# Patient Record
Sex: Female | Born: 1970 | Race: White | Hispanic: No | Marital: Single | Smoking: Current every day smoker
Health system: Southern US, Community
[De-identification: ages and names within clinical notes are randomized; demographics above are authoritative.]

## PROBLEM LIST (undated history)

## (undated) DIAGNOSIS — F419 Anxiety disorder, unspecified: Secondary | ICD-10-CM

## (undated) DIAGNOSIS — K219 Gastro-esophageal reflux disease without esophagitis: Secondary | ICD-10-CM

## (undated) DIAGNOSIS — C801 Malignant (primary) neoplasm, unspecified: Secondary | ICD-10-CM

## (undated) DIAGNOSIS — J45909 Unspecified asthma, uncomplicated: Secondary | ICD-10-CM

## (undated) DIAGNOSIS — E119 Type 2 diabetes mellitus without complications: Secondary | ICD-10-CM

## (undated) DIAGNOSIS — F32A Depression, unspecified: Secondary | ICD-10-CM

## (undated) DIAGNOSIS — F329 Major depressive disorder, single episode, unspecified: Secondary | ICD-10-CM

## (undated) DIAGNOSIS — Q359 Cleft palate, unspecified: Secondary | ICD-10-CM

## (undated) HISTORY — DX: Unspecified asthma, uncomplicated: J45.909

## (undated) HISTORY — PX: JOINT REPLACEMENT: SHX530

## (undated) HISTORY — PX: TOTAL HIP ARTHROPLASTY: SHX124

## (undated) HISTORY — DX: Depression, unspecified: F32.A

## (undated) HISTORY — DX: Major depressive disorder, single episode, unspecified: F32.9

## (undated) HISTORY — DX: Anxiety disorder, unspecified: F41.9

## (undated) HISTORY — PX: TUBAL LIGATION: SHX77

## (undated) HISTORY — PX: MOUTH SURGERY: SHX715

## (undated) HISTORY — DX: Gastro-esophageal reflux disease without esophagitis: K21.9

## (undated) HISTORY — DX: Malignant (primary) neoplasm, unspecified: C80.1

---

## 1998-08-09 ENCOUNTER — Emergency Department (HOSPITAL_COMMUNITY): Admission: EM | Admit: 1998-08-09 | Discharge: 1998-08-09 | Payer: Self-pay | Admitting: Emergency Medicine

## 1998-09-01 ENCOUNTER — Emergency Department (HOSPITAL_COMMUNITY): Admission: EM | Admit: 1998-09-01 | Discharge: 1998-09-01 | Payer: Self-pay | Admitting: Emergency Medicine

## 1998-10-15 ENCOUNTER — Emergency Department (HOSPITAL_COMMUNITY): Admission: EM | Admit: 1998-10-15 | Discharge: 1998-10-15 | Payer: Self-pay | Admitting: Emergency Medicine

## 1999-04-22 ENCOUNTER — Emergency Department (HOSPITAL_COMMUNITY): Admission: EM | Admit: 1999-04-22 | Discharge: 1999-04-22 | Payer: Self-pay | Admitting: Emergency Medicine

## 1999-05-13 ENCOUNTER — Inpatient Hospital Stay (HOSPITAL_COMMUNITY): Admission: AD | Admit: 1999-05-13 | Discharge: 1999-05-17 | Payer: Self-pay | Admitting: *Deleted

## 2001-02-02 ENCOUNTER — Emergency Department (HOSPITAL_COMMUNITY): Admission: EM | Admit: 2001-02-02 | Discharge: 2001-02-02 | Payer: Self-pay | Admitting: *Deleted

## 2001-02-17 ENCOUNTER — Emergency Department (HOSPITAL_COMMUNITY): Admission: EM | Admit: 2001-02-17 | Discharge: 2001-02-17 | Payer: Self-pay | Admitting: *Deleted

## 2001-04-19 ENCOUNTER — Ambulatory Visit (HOSPITAL_COMMUNITY): Admission: RE | Admit: 2001-04-19 | Discharge: 2001-04-19 | Payer: Self-pay | Admitting: Pulmonary Disease

## 2002-02-23 ENCOUNTER — Emergency Department (HOSPITAL_COMMUNITY): Admission: EM | Admit: 2002-02-23 | Discharge: 2002-02-23 | Payer: Self-pay | Admitting: Emergency Medicine

## 2002-04-20 ENCOUNTER — Emergency Department (HOSPITAL_COMMUNITY): Admission: EM | Admit: 2002-04-20 | Discharge: 2002-04-20 | Payer: Self-pay | Admitting: *Deleted

## 2002-05-18 ENCOUNTER — Encounter: Payer: Self-pay | Admitting: Internal Medicine

## 2002-05-18 ENCOUNTER — Ambulatory Visit: Admission: RE | Admit: 2002-05-18 | Discharge: 2002-05-18 | Payer: Self-pay | Admitting: Internal Medicine

## 2002-07-28 ENCOUNTER — Emergency Department (HOSPITAL_COMMUNITY): Admission: EM | Admit: 2002-07-28 | Discharge: 2002-07-28 | Payer: Self-pay | Admitting: *Deleted

## 2002-07-28 ENCOUNTER — Encounter: Payer: Self-pay | Admitting: *Deleted

## 2003-11-28 ENCOUNTER — Emergency Department (HOSPITAL_COMMUNITY): Admission: EM | Admit: 2003-11-28 | Discharge: 2003-11-28 | Payer: Self-pay | Admitting: Emergency Medicine

## 2003-12-15 ENCOUNTER — Emergency Department (HOSPITAL_COMMUNITY): Admission: EM | Admit: 2003-12-15 | Discharge: 2003-12-15 | Payer: Self-pay | Admitting: Emergency Medicine

## 2003-12-17 ENCOUNTER — Emergency Department (HOSPITAL_COMMUNITY): Admission: EM | Admit: 2003-12-17 | Discharge: 2003-12-17 | Payer: Self-pay | Admitting: Emergency Medicine

## 2003-12-19 ENCOUNTER — Emergency Department (HOSPITAL_COMMUNITY): Admission: EM | Admit: 2003-12-19 | Discharge: 2003-12-19 | Payer: Self-pay | Admitting: Emergency Medicine

## 2004-01-06 ENCOUNTER — Emergency Department (HOSPITAL_COMMUNITY): Admission: EM | Admit: 2004-01-06 | Discharge: 2004-01-06 | Payer: Self-pay | Admitting: *Deleted

## 2004-02-07 ENCOUNTER — Emergency Department (HOSPITAL_COMMUNITY): Admission: EM | Admit: 2004-02-07 | Discharge: 2004-02-07 | Payer: Self-pay | Admitting: Emergency Medicine

## 2004-04-03 ENCOUNTER — Ambulatory Visit (HOSPITAL_COMMUNITY): Admission: RE | Admit: 2004-04-03 | Discharge: 2004-04-03 | Payer: Self-pay | Admitting: Internal Medicine

## 2004-04-09 ENCOUNTER — Emergency Department (HOSPITAL_COMMUNITY): Admission: EM | Admit: 2004-04-09 | Discharge: 2004-04-09 | Payer: Self-pay | Admitting: Emergency Medicine

## 2004-04-11 ENCOUNTER — Emergency Department (HOSPITAL_COMMUNITY): Admission: EM | Admit: 2004-04-11 | Discharge: 2004-04-11 | Payer: Self-pay | Admitting: Emergency Medicine

## 2004-04-14 ENCOUNTER — Emergency Department (HOSPITAL_COMMUNITY): Admission: EM | Admit: 2004-04-14 | Discharge: 2004-04-14 | Payer: Self-pay | Admitting: Emergency Medicine

## 2004-05-04 ENCOUNTER — Emergency Department (HOSPITAL_COMMUNITY): Admission: EM | Admit: 2004-05-04 | Discharge: 2004-05-04 | Payer: Self-pay | Admitting: Emergency Medicine

## 2004-06-07 ENCOUNTER — Emergency Department (HOSPITAL_COMMUNITY): Admission: EM | Admit: 2004-06-07 | Discharge: 2004-06-07 | Payer: Self-pay | Admitting: Emergency Medicine

## 2004-07-10 ENCOUNTER — Emergency Department (HOSPITAL_COMMUNITY): Admission: EM | Admit: 2004-07-10 | Discharge: 2004-07-10 | Payer: Self-pay | Admitting: Emergency Medicine

## 2004-10-30 ENCOUNTER — Emergency Department (HOSPITAL_COMMUNITY): Admission: EM | Admit: 2004-10-30 | Discharge: 2004-10-30 | Payer: Self-pay | Admitting: Emergency Medicine

## 2004-11-02 ENCOUNTER — Emergency Department (HOSPITAL_COMMUNITY): Admission: EM | Admit: 2004-11-02 | Discharge: 2004-11-02 | Payer: Self-pay | Admitting: Emergency Medicine

## 2005-02-17 ENCOUNTER — Emergency Department (HOSPITAL_COMMUNITY): Admission: EM | Admit: 2005-02-17 | Discharge: 2005-02-17 | Payer: Self-pay | Admitting: *Deleted

## 2005-04-22 ENCOUNTER — Ambulatory Visit (HOSPITAL_COMMUNITY): Admission: AD | Admit: 2005-04-22 | Discharge: 2005-04-22 | Payer: Self-pay | Admitting: Obstetrics and Gynecology

## 2005-04-24 ENCOUNTER — Ambulatory Visit (HOSPITAL_COMMUNITY): Admission: AD | Admit: 2005-04-24 | Discharge: 2005-04-24 | Payer: Self-pay | Admitting: Obstetrics and Gynecology

## 2005-04-28 ENCOUNTER — Ambulatory Visit (HOSPITAL_COMMUNITY): Admission: AD | Admit: 2005-04-28 | Discharge: 2005-04-28 | Payer: Self-pay | Admitting: Obstetrics and Gynecology

## 2005-04-29 ENCOUNTER — Ambulatory Visit (HOSPITAL_COMMUNITY): Admission: AD | Admit: 2005-04-29 | Discharge: 2005-04-29 | Payer: Self-pay | Admitting: Obstetrics and Gynecology

## 2005-05-02 ENCOUNTER — Ambulatory Visit (HOSPITAL_COMMUNITY): Admission: AD | Admit: 2005-05-02 | Discharge: 2005-05-02 | Payer: Self-pay | Admitting: Obstetrics and Gynecology

## 2005-05-05 ENCOUNTER — Ambulatory Visit (HOSPITAL_COMMUNITY): Admission: AD | Admit: 2005-05-05 | Discharge: 2005-05-05 | Payer: Self-pay | Admitting: Obstetrics and Gynecology

## 2005-05-07 ENCOUNTER — Encounter: Payer: Self-pay | Admitting: Obstetrics and Gynecology

## 2005-05-07 ENCOUNTER — Inpatient Hospital Stay (HOSPITAL_COMMUNITY): Admission: AD | Admit: 2005-05-07 | Discharge: 2005-05-10 | Payer: Self-pay | Admitting: Obstetrics and Gynecology

## 2005-06-05 ENCOUNTER — Ambulatory Visit (HOSPITAL_COMMUNITY): Admission: RE | Admit: 2005-06-05 | Discharge: 2005-06-05 | Payer: Self-pay | Admitting: Family Medicine

## 2006-12-31 ENCOUNTER — Ambulatory Visit (HOSPITAL_COMMUNITY): Admission: RE | Admit: 2006-12-31 | Discharge: 2006-12-31 | Payer: Self-pay | Admitting: Family Medicine

## 2007-02-02 ENCOUNTER — Encounter (HOSPITAL_COMMUNITY): Admission: RE | Admit: 2007-02-02 | Discharge: 2007-03-04 | Payer: Self-pay | Admitting: Neurological Surgery

## 2007-02-05 ENCOUNTER — Encounter: Admission: RE | Admit: 2007-02-05 | Discharge: 2007-02-05 | Payer: Self-pay | Admitting: Neurological Surgery

## 2007-04-30 ENCOUNTER — Encounter: Admission: RE | Admit: 2007-04-30 | Discharge: 2007-04-30 | Payer: Self-pay | Admitting: Neurological Surgery

## 2007-05-13 ENCOUNTER — Emergency Department (HOSPITAL_COMMUNITY): Admission: EM | Admit: 2007-05-13 | Discharge: 2007-05-13 | Payer: Self-pay | Admitting: Emergency Medicine

## 2007-06-18 ENCOUNTER — Encounter: Admission: RE | Admit: 2007-06-18 | Discharge: 2007-06-18 | Payer: Self-pay | Admitting: Neurological Surgery

## 2007-06-30 ENCOUNTER — Ambulatory Visit (HOSPITAL_COMMUNITY): Admission: RE | Admit: 2007-06-30 | Discharge: 2007-06-30 | Payer: Self-pay | Admitting: Family Medicine

## 2007-06-30 ENCOUNTER — Emergency Department (HOSPITAL_COMMUNITY): Admission: EM | Admit: 2007-06-30 | Discharge: 2007-06-30 | Payer: Self-pay | Admitting: Emergency Medicine

## 2009-09-09 ENCOUNTER — Emergency Department (HOSPITAL_COMMUNITY): Admission: EM | Admit: 2009-09-09 | Discharge: 2009-09-09 | Payer: Self-pay | Admitting: Emergency Medicine

## 2010-07-13 ENCOUNTER — Encounter: Payer: Self-pay | Admitting: Family Medicine

## 2010-07-14 ENCOUNTER — Encounter: Payer: Self-pay | Admitting: Neurological Surgery

## 2010-07-15 ENCOUNTER — Encounter: Payer: Self-pay | Admitting: Neurological Surgery

## 2010-11-08 NOTE — Op Note (Signed)
Diane Buchanan, Diane Buchanan NO.:  [id number]   MEDICAL RECORD NO.:  [id number]          PATIENT TYPE:  INP   LOCATION:  A412                          FACILITY:  [facility]   PHYSICIAN:  Tilda Burrow, M.D. DATE OF BIRTH:  03-21-1971   DATE OF PROCEDURE:  DATE OF DISCHARGE:                                 OPERATIVE REPORT   PREOPERATIVE DIAGNOSIS:  Pregnancy of 39-1/2 weeks.  Scheduled induction.  Uncertain fetal status.  Desire for elective permanent sterilization.  Methadone use for chronic pain and narcotic addiction.   POSTOPERATIVE DIAGNOSIS:  Pregnancy of 39-1/2 weeks.  Scheduled induction.  Uncertain fetal status.  Elective permanent sterilization.  Methadone use  for chronic pain and narcotic addiction.  Oligohydramnios.   PROCEDURE:  Primary low transverse cesarean section, bilateral partial  salpingectomy.   SURGEON:  Tilda Burrow, M.D.   ASSISTANTHart Rochester, CMN.   ANESTHESIA:  Nelda Severe, CRNA.  Spinal, converted to general.   COMPLICATIONS:  None.   FINDINGS:  1.  Incomplete analgesic effect from symmetric spinal, necessitating      conversion to general anesthesia.  2.  Healthy female infant, Apgars 9 and 9 with oligohydramnios.   INDICATIONS:  A 40 year old scheduled for induction at 39 weeks with  reactive NST two days ago with spontaneous decelerations to the 60s as we  prepared her for induction.  The Foley bulb was removed, but she nonetheless  had prolonged decelerations persisting, so was taken to the OR for C-  section.  Permanent sterilization requested.   DETAILS OF PROCEDURE:  Patient was taken to the operating room, prepped and  draped.  Spinal was not adequately dense but very effective and very  symmetric in its distribution, so we converted to general.  A Pfannenstiel  incision performed without difficulty.  Bladder flap developed.  A  transverse uterine incision made on a very well-developed lower uterine  segment, being particularly  careful to avoid contact on the fetal scalp,  which was tightly adherent to the lower uterine segment.  We were able to  identify the minimal amniotic fluid between the head and lower uterine  segment.  We opened the lower uterine segment transversely using index  finger traction and delivered the vertex with fundal pressure and guidance.  The infant showed good tone and grimace.  Bulb suction was performed.  There  was minimal amniotic fluid around the baby.  We passed the baby to Dr. Milford Cage  for his care.  See his notes for further details.  Apgars 9 and 9.   Cord blood samples obtained to mixed arterial and venous pH sample and  venous sample for routine analysis.  Placenta was delivered intact but in  swabbing the uterine fundus, there was decidual tissue, raising the question  if there had been a subplacental bleed in the past.  This was sent as a  separate specimen.   We then irrigated the uterus, closed it with a single layer of running,  locking 0 chromic.  Reapproximated the bladder flap with 2-0 chromic.  Reapproximated the peritoneum.  Prior to peritoneal closure, we did  tubal  ligation by doubly ligating random and segment knuckle tube and excising the  incarcerated specimen.  We then proceeded to close the peritoneum and close  the fascia with 0 Vicryl.  Close the subcutaneous tissues with 2-0 plain  after irrigating the antibiotic solution, then close the skin with staples.  EBL 500 cc.   Condition to the recovery room excellent.  Patient will be maintained on her  methadone during the operating room and weaned gradually in the coming  months.      Tilda Burrow, M.D.  Electronically Signed     JVF/MEDQ  D:  05/07/2005  T:  05/07/2005  Job:  [id number]   cc:   Francoise Schaumann. Halford Chessman  Fax: [phone]

## 2010-11-08 NOTE — H&P (Signed)
Diane Buchanan, Diane Buchanan NO.:  [id number]   MEDICAL RECORD NO.:  [id number]          PATIENT TYPE:  INP   LOCATION:  LDR1                          FACILITY:  [facility]   PHYSICIAN:  Tilda Burrow, M.D. DATE OF BIRTH:  April 10, 1971   DATE OF ADMISSION:  05/07/2005  DATE OF DISCHARGE:  LH                                HISTORY & PHYSICAL   REASON FOR ADMISSION:  Pregnancy at 40 weeks for induction of labor.   HISTORY OF PRESENT ILLNESS:  Diane Buchanan is a 40 year old gravida 3, para 2 who  presents for induction of labor due to methadone addiction and heroin  addiction.   PAST MEDICAL HISTORY:  Past medical history is positive for cleft lip and  palate, spina bifida, heroin addiction, methadone addiction.   PAST SURGICAL HISTORY:  Past surgical history is positive for palate  surgery.   ALLERGIES:  No known drug allergies.   CURRENT MEDICATIONS:  She is taking hydrocodone, methadone.   PRENATAL COURSE:  Prenatal course has been complicated with significant  methadone use, significant nicotine addiction, and also p.o. pain medication  and nausea medicines.  Blood type is RH negative, O-negative.  Rubella is  immune.  Hepatitis C surface antigen is negative.  HIV is non-reactive.  Serology is non-reactive.  Pap is normal.  __________ is negative.  The 28-  week hemoglobin is 11.1, 28-week hematocrit is 36.8, one-hour glucose is  107.   PHYSICAL EXAMINATION:  VITAL SIGNS:  Vital signs are stable.  Fetal heart  rate pattern is stable, but there are variable prolonged decelerations at  intervals that happen spontaneously without uterine contractions.  Cervix is  2 cm, 80% effaced, and -1 station.   PLAN:  We are going to do primary Cesarean section with bilateral tubal  ligation.      Diane Buchanan, Diane Buchanan      Tilda Burrow, M.D.  Electronically Signed    DL/MEDQ  D:  04/54/0981  T:  05/07/2005  Job:  [id number]   cc:   Tilda Burrow, M.D.  Fax: [phone]

## 2010-11-08 NOTE — Discharge Summary (Signed)
Diane Buchanan, Diane Buchanan NO.:  [id number]   MEDICAL RECORD NO.:  [id number]          PATIENT TYPE:  INP   LOCATION:  A412                          FACILITY:  [facility]   PHYSICIAN:  Tilda Burrow, M.D. DATE OF BIRTH:  [birth year]   DATE OF ADMISSION:  05/07/2005  DATE OF DISCHARGE:  11/18/2006LH                                 DISCHARGE SUMMARY   ADMITTING DIAGNOSES:  1.  Pregnancy term.  2.  Active labor.  3.  Methadone addiction.  4.  History of oxycodone addiction.  5.  Elective sterilization.   DISCHARGE DIAGNOSES:  1.  Pregnancy at 40 weeks delivered uncertain fetal status.  2.  Methadone addiction (opiate addiction).  3.  Elective sterilization.   PROCEDURE:  Primary low transverse cervical cesarean section and bilateral  partial salpingectomy performed on May 07, 2005 by Jannifer Franklin.   HOSPITAL SUMMARY:  This 40 year old female gravida 3, para 2 was admitted  for induction of labor due to methadone addiction.  Medical history is  positive for cleft lip and cleft palate, history of spina bifida occulta,  for which patient blames her chronic back pain that is her alleged reason  for her addiction.  Surgical history is positive for palate surgery which  did not completely close over the palate.  No known drug allergies.  Medications hydrocodone and methadone.  Prenatal course was notable for  Vicodin and Tylox overuse until late in the pregnancy when we felt that the  methadone  was a superior option to her extensive oxycodone use.   Patient was admitted on November 15 and underwent insertion of Foley bulb  through the cervix with the patient having variable decelerations as a  result of it.  She then had IV stopped, O2 started and Foley bulb removed.  The patient had a spontaneous deceleration again 10 minutes later while on  no tocolytic agent.  Discussion was performed and decision made to proceed  with surgery.   Postoperative course was  notable for unusually high Dilaudid and Valium use  for the patient.  She maximized her Dilaudid PCA Pump use even when on 1.5  times the normal dose.  Postoperatively hemoglobin was 10, hematocrit 30.  Patient was considered stable for discharge and routine postsurgical  instructions when discharged May 10, 2005 for followup 1 week our  office.      Tilda Burrow, M.D.  Electronically Signed     JVF/MEDQ  D:  05/26/2005  T:  05/26/2005  Job:  [id number]

## 2010-11-19 ENCOUNTER — Ambulatory Visit (HOSPITAL_COMMUNITY)
Admission: RE | Admit: 2010-11-19 | Discharge: 2010-11-19 | Disposition: A | Discharge status: Home / Self Care | Payer: Medicaid Other | Source: Ambulatory Visit | Attending: Family Medicine | Admitting: Family Medicine

## 2010-11-19 ENCOUNTER — Other Ambulatory Visit (HOSPITAL_COMMUNITY): Payer: Self-pay | Admitting: Family Medicine

## 2010-11-19 DIAGNOSIS — T148XXA Other injury of unspecified body region, initial encounter: Secondary | ICD-10-CM

## 2010-11-19 DIAGNOSIS — M79609 Pain in unspecified limb: Secondary | ICD-10-CM | POA: Insufficient documentation

## 2010-11-19 DIAGNOSIS — M2010 Hallux valgus (acquired), unspecified foot: Secondary | ICD-10-CM | POA: Insufficient documentation

## 2010-12-26 ENCOUNTER — Ambulatory Visit (INDEPENDENT_AMBULATORY_CARE_PROVIDER_SITE_OTHER): Payer: Medicaid Other | Admitting: Otolaryngology

## 2011-01-02 ENCOUNTER — Ambulatory Visit (INDEPENDENT_AMBULATORY_CARE_PROVIDER_SITE_OTHER): Payer: Medicaid Other | Admitting: Otolaryngology

## 2011-01-16 ENCOUNTER — Ambulatory Visit (INDEPENDENT_AMBULATORY_CARE_PROVIDER_SITE_OTHER): Payer: Medicaid Other | Admitting: Otolaryngology

## 2011-01-23 ENCOUNTER — Ambulatory Visit (INDEPENDENT_AMBULATORY_CARE_PROVIDER_SITE_OTHER): Payer: Medicaid Other | Admitting: Otolaryngology

## 2011-01-23 DIAGNOSIS — K2100 Gastro-esophageal reflux disease with esophagitis, without bleeding: Secondary | ICD-10-CM

## 2011-01-23 DIAGNOSIS — R490 Dysphonia: Secondary | ICD-10-CM

## 2013-07-14 ENCOUNTER — Ambulatory Visit (INDEPENDENT_AMBULATORY_CARE_PROVIDER_SITE_OTHER): Payer: Medicaid Other | Admitting: Otolaryngology

## 2013-07-14 DIAGNOSIS — K219 Gastro-esophageal reflux disease without esophagitis: Secondary | ICD-10-CM

## 2013-08-25 ENCOUNTER — Ambulatory Visit (INDEPENDENT_AMBULATORY_CARE_PROVIDER_SITE_OTHER): Payer: Medicaid Other | Admitting: Otolaryngology

## 2013-12-02 ENCOUNTER — Emergency Department (HOSPITAL_COMMUNITY)
Admission: EM | Admit: 2013-12-02 | Discharge: 2013-12-02 | Disposition: A | Discharge status: Home / Self Care | Payer: Medicaid Other | Attending: Emergency Medicine | Admitting: Emergency Medicine

## 2013-12-02 ENCOUNTER — Encounter (HOSPITAL_COMMUNITY): Payer: Self-pay | Admitting: Emergency Medicine

## 2013-12-02 DIAGNOSIS — E119 Type 2 diabetes mellitus without complications: Secondary | ICD-10-CM | POA: Insufficient documentation

## 2013-12-02 DIAGNOSIS — Z8739 Personal history of other diseases of the musculoskeletal system and connective tissue: Secondary | ICD-10-CM | POA: Insufficient documentation

## 2013-12-02 DIAGNOSIS — Z79899 Other long term (current) drug therapy: Secondary | ICD-10-CM | POA: Insufficient documentation

## 2013-12-02 DIAGNOSIS — F17200 Nicotine dependence, unspecified, uncomplicated: Secondary | ICD-10-CM | POA: Insufficient documentation

## 2013-12-02 DIAGNOSIS — R739 Hyperglycemia, unspecified: Secondary | ICD-10-CM

## 2013-12-02 HISTORY — DX: Type 2 diabetes mellitus without complications: E11.9

## 2013-12-02 HISTORY — DX: Cleft palate, unspecified: Q35.9

## 2013-12-02 LAB — CBG MONITORING, ED: GLUCOSE-CAPILLARY: 310 mg/dL — AB (ref 70–99)

## 2013-12-02 NOTE — ED Provider Notes (Signed)
CSN: [id number]     Arrival date & time 12/02/13  1401 History  This chart was scribed for Nat Christen, MD by Roe Coombs, ED Scribe. The patient was seen in room APA04/APA04. Patient's care was started at 2:41 PM.   Chief Complaint  Patient presents with  . Hyperglycemia    The history is provided by the patient. No language interpreter was used.    HPI Comments: Diane Buchanan is a 43 y.o. female with history of DM who presents to the Emergency Department complaining of hyperglycemia. Patient says that she ate tacos and had ice cream last night for dinner. When she woke up this morning, her sugar was elevated to 500. When she arrived in the ED, her sugar was improved to 300. She takes two 500 mg of metformin twice daily. Patient admits that she understands why her sugar became elevated based on her meal last night. She denies any physical complaints at this time. There is no headache, shortness of breath, chest pain, dizziness or urinary symptoms. She states she has an 41 year old and gets a lot of exercise.   PCP - Maricela Curet, MD    Past Medical History  Diagnosis Date  . Diabetes mellitus without complication   . Cleft palate    Past Surgical History  Procedure Laterality Date  . Joint replacement    . Total hip arthroplasty    . Tubal ligation     History reviewed. No pertinent family history. History  Substance Use Topics  . Smoking status: Current Every Day Smoker  . Smokeless tobacco: Not on file  . Alcohol Use: No   OB History   Grav Para Term Preterm Abortions TAB SAB Ect Mult Living                 Review of Systems A complete 10 system review of systems was obtained and all systems are negative except as noted in the HPI and PMH.   Allergies  Review of patient's allergies indicates no known allergies.  Home Medications   Prior to Admission medications   Medication Sig Start Date End Date Taking? Authorizing Provider  ALPRAZolam Duanne Moron) 1 MG  tablet Take 1 mg by mouth 4 (four) times daily as needed for anxiety.   Yes Historical Provider, MD  amphetamine-dextroamphetamine (ADDERALL) 20 MG tablet Take 10 mg by mouth 2 (two) times daily.   Yes Historical Provider, MD  cyclobenzaprine (FLEXERIL) 10 MG tablet Take 10 mg by mouth 3 (three) times daily as needed for muscle spasms.   Yes Historical Provider, MD  desvenlafaxine (PRISTIQ) 50 MG 24 hr tablet Take 50 mg by mouth daily.   Yes Historical Provider, MD  metFORMIN (GLUCOPHAGE) 500 MG tablet Take 500 mg by mouth 2 (two) times daily with a meal.   Yes Historical Provider, MD  methadone (DOLOPHINE) 10 MG tablet Take 10 mg by mouth 3 (three) times daily.   Yes Historical Provider, MD  pregabalin (LYRICA) 150 MG capsule Take 150 mg by mouth 3 (three) times daily.   Yes Historical Provider, MD   Triage Vitals: BP 137/92  Pulse 77  Temp(Src) 97.9 F (36.6 C) (Oral)  Resp 18  Ht 5\' 6"  (1.676 m)  Wt 188 lb 3 oz (85.361 kg)  BMI 30.39 kg/m2  SpO2 98%  LMP 12/02/2013 Physical Exam  Nursing note and vitals reviewed. Constitutional: She is oriented to person, place, and time. She appears well-developed and well-nourished.  HENT:  Head: Normocephalic and  atraumatic.  Eyes: Conjunctivae and EOM are normal. Pupils are equal, round, and reactive to light.  Neck: Normal range of motion. Neck supple.  Cardiovascular: Normal rate, regular rhythm and normal heart sounds.   Pulmonary/Chest: Effort normal and breath sounds normal.  Abdominal: Soft. Bowel sounds are normal.  Musculoskeletal: Normal range of motion.  Neurological: She is alert and oriented to person, place, and time.  Skin: Skin is warm and dry.  Psychiatric: She has a normal mood and affect. Her behavior is normal.    ED Course  Procedures (including critical care time) DIAGNOSTIC STUDIES: Oxygen Saturation is 98% on room air, normal by my interpretation.    COORDINATION OF CARE: 2:46 PM- I will send her home. Patient  informed of current plan for treatment and evaluation and agrees with plan at this time.  Results for orders placed during the hospital encounter of 12/02/13  CBG MONITORING, ED      Result Value Ref Range   Glucose-Capillary 310 (*) 70 - 99 mg/dL   Comment 1 Notify RN      MDM   Final diagnoses:  Hyperglycemia    Glucose is now 310.    Patient has no somatic complaints.    Continue same protocol  I personally performed the services described in this documentation, which was scribed in my presence. The recorded information has been reviewed and is accurate.    Nat Christen, MD 12/02/13 [phone]

## 2013-12-02 NOTE — Discharge Instructions (Signed)
Follow your sugars carefully. Try to lose weight.

## 2013-12-02 NOTE — ED Notes (Addendum)
Pt says  Her cbg was over 500.   Had eaten ice cream app 5am.Took metformin  pta  CBG here was 310.  Pts speech is difficult to understand due to cleft palate

## 2013-12-02 NOTE — ED Notes (Signed)
Pt states had tacos last night and ice cream. STates when she woke up took her sugar and was 500. Pt states took her morning metformin and came this here. Pt states feels fine otherwise, no complaints. Pt denies pain. Pt states since sugar is already coming down feels like she doesn't need to be here but will do whatever we need her to do.

## 2014-12-04 ENCOUNTER — Emergency Department (HOSPITAL_COMMUNITY): Payer: Medicaid Other

## 2014-12-04 ENCOUNTER — Emergency Department (HOSPITAL_COMMUNITY)
Admission: EM | Admit: 2014-12-04 | Discharge: 2014-12-04 | Disposition: A | Discharge status: Home / Self Care | Payer: Medicaid Other | Attending: Emergency Medicine | Admitting: Emergency Medicine

## 2014-12-04 ENCOUNTER — Encounter (HOSPITAL_COMMUNITY): Payer: Self-pay | Admitting: Cardiology

## 2014-12-04 DIAGNOSIS — Y9389 Activity, other specified: Secondary | ICD-10-CM | POA: Diagnosis not present

## 2014-12-04 DIAGNOSIS — X58XXXA Exposure to other specified factors, initial encounter: Secondary | ICD-10-CM | POA: Insufficient documentation

## 2014-12-04 DIAGNOSIS — E119 Type 2 diabetes mellitus without complications: Secondary | ICD-10-CM | POA: Insufficient documentation

## 2014-12-04 DIAGNOSIS — Z79899 Other long term (current) drug therapy: Secondary | ICD-10-CM | POA: Insufficient documentation

## 2014-12-04 DIAGNOSIS — Y998 Other external cause status: Secondary | ICD-10-CM | POA: Insufficient documentation

## 2014-12-04 DIAGNOSIS — Y9289 Other specified places as the place of occurrence of the external cause: Secondary | ICD-10-CM | POA: Insufficient documentation

## 2014-12-04 DIAGNOSIS — Z720 Tobacco use: Secondary | ICD-10-CM | POA: Diagnosis not present

## 2014-12-04 DIAGNOSIS — R4182 Altered mental status, unspecified: Secondary | ICD-10-CM | POA: Diagnosis present

## 2014-12-04 DIAGNOSIS — Q359 Cleft palate, unspecified: Secondary | ICD-10-CM | POA: Diagnosis not present

## 2014-12-04 DIAGNOSIS — T40601A Poisoning by unspecified narcotics, accidental (unintentional), initial encounter: Secondary | ICD-10-CM | POA: Diagnosis not present

## 2014-12-04 LAB — RAPID URINE DRUG SCREEN, HOSP PERFORMED
Amphetamines: NOT DETECTED
BENZODIAZEPINES: POSITIVE — AB
Barbiturates: NOT DETECTED
COCAINE: NOT DETECTED
OPIATES: NOT DETECTED
Tetrahydrocannabinol: POSITIVE — AB

## 2014-12-04 LAB — BLOOD GAS, VENOUS
ACID-BASE EXCESS: 2.9 mmol/L — AB (ref 0.0–2.0)
Bicarbonate: 28.5 mEq/L — ABNORMAL HIGH (ref 20.0–24.0)
O2 Saturation: 79.4 %
PATIENT TEMPERATURE: 37
PH VEN: 7.315 — AB (ref 7.250–7.300)
TCO2: 25.9 mmol/L (ref 0–100)
pCO2, Ven: 57.8 mmHg — ABNORMAL HIGH (ref 45.0–50.0)
pO2, Ven: 46.8 mmHg — ABNORMAL HIGH (ref 30.0–45.0)

## 2014-12-04 LAB — URINALYSIS, ROUTINE W REFLEX MICROSCOPIC
BILIRUBIN URINE: NEGATIVE
Glucose, UA: >1000 mg/dL — AB
Hgb urine dipstick: NEGATIVE
Ketones, ur: NEGATIVE mg/dL
NITRITE: NEGATIVE
PH: 6 (ref 5.0–8.0)
Protein, ur: NEGATIVE mg/dL
SPECIFIC GRAVITY, URINE: 1.01 (ref 1.005–1.030)
UROBILINOGEN UA: 0.2 mg/dL (ref 0.0–1.0)

## 2014-12-04 LAB — CBC WITH DIFFERENTIAL/PLATELET
BASOS PCT: 0 % (ref 0–1)
Basophils Absolute: 0 10*3/uL (ref 0.0–0.1)
Eosinophils Absolute: 0.1 10*3/uL (ref 0.0–0.7)
Eosinophils Relative: 1 % (ref 0–5)
HEMATOCRIT: 40.7 % (ref 36.0–46.0)
HEMOGLOBIN: 13.5 g/dL (ref 12.0–15.0)
LYMPHS ABS: 2.9 10*3/uL (ref 0.7–4.0)
Lymphocytes Relative: 31 % (ref 12–46)
MCH: 29.3 pg (ref 26.0–34.0)
MCHC: 33.2 g/dL (ref 30.0–36.0)
MCV: 88.5 fL (ref 78.0–100.0)
MONOS PCT: 8 % (ref 3–12)
Monocytes Absolute: 0.8 10*3/uL (ref 0.1–1.0)
NEUTROS ABS: 5.5 10*3/uL (ref 1.7–7.7)
NEUTROS PCT: 60 % (ref 43–77)
Platelets: 190 10*3/uL (ref 150–400)
RBC: 4.6 MIL/uL (ref 3.87–5.11)
RDW: 13.3 % (ref 11.5–15.5)
WBC: 9.3 10*3/uL (ref 4.0–10.5)

## 2014-12-04 LAB — COMPREHENSIVE METABOLIC PANEL
ALT: 99 U/L — ABNORMAL HIGH (ref 14–54)
AST: 80 U/L — AB (ref 15–41)
Albumin: 4.2 g/dL (ref 3.5–5.0)
Alkaline Phosphatase: 83 U/L (ref 38–126)
Anion gap: 10 (ref 5–15)
BUN: 10 mg/dL (ref 6–20)
CALCIUM: 9.1 mg/dL (ref 8.9–10.3)
CO2: 29 mmol/L (ref 22–32)
CREATININE: 0.43 mg/dL — AB (ref 0.44–1.00)
Chloride: 102 mmol/L (ref 101–111)
GFR calc Af Amer: >60 mL/min (ref >60)
Glucose, Bld: 159 mg/dL — ABNORMAL HIGH (ref 65–99)
Potassium: 3.2 mmol/L — ABNORMAL LOW (ref 3.5–5.1)
SODIUM: 141 mmol/L (ref 135–145)
TOTAL PROTEIN: 7.4 g/dL (ref 6.5–8.1)
Total Bilirubin: 0.6 mg/dL (ref 0.3–1.2)

## 2014-12-04 LAB — LACTIC ACID, PLASMA: LACTIC ACID, VENOUS: 2.3 mmol/L — AB (ref 0.5–2.0)

## 2014-12-04 LAB — URINE MICROSCOPIC-ADD ON

## 2014-12-04 LAB — ACETAMINOPHEN LEVEL

## 2014-12-04 LAB — SALICYLATE LEVEL

## 2014-12-04 MED ORDER — NALOXONE HCL 1 MG/ML IJ SOLN: 1.0000 mg | Freq: Once | INTRAMUSCULAR | Status: DC

## 2014-12-04 NOTE — Discharge Instructions (Signed)
Narcotic Overdose  A narcotic overdose is the misuse or overuse of a narcotic drug. A narcotic overdose can make you pass out and stop breathing. If you are not treated right away, this can cause permanent brain damage or stop your heart. Medicine may be given to reverse the effects of an overdose. If so, this medicine may bring on withdrawal symptoms. The symptoms may be abdominal cramps, throwing up (vomiting), sweating, chills, and nervousness.  Injecting narcotics can cause more problems than just an overdose. AIDS, hepatitis, and other very serious infections are transmitted by sharing needles and syringes. If you decide to quit using, there are medicines which can help you through the withdrawal period. Trying to quit all at once on your own can be uncomfortable, but not life-threatening. Call your caregiver, Narcotics Anonymous, or any drug and alcohol treatment program for further help.   Document Released: 07/17/2004 Document Revised: 09/01/2011 Document Reviewed: 05/11/2009  Exit[employer]® Patient Information ©2015 [employer]. This information is not intended to replace advice given to you by your health care provider. Make sure you discuss any questions you have with your health care provider.

## 2014-12-04 NOTE — ED Notes (Signed)
Found unresponsive by EMS.  Patient  was laying next to broken coffee table.  cbg 144.  Narcan 4 mg given.  ?heroin addiction.  Pt has been on methadone for years.  Pt awake now but not  Responding appropriately.

## 2014-12-04 NOTE — ED Notes (Signed)
Critical lab value Lactic Acid 2.3 reported to Dr Venora Maples

## 2014-12-04 NOTE — ED Notes (Signed)
CRITICAL VALUE ALERT  Critical value received:  Lactic Acid  Date of notification:  t  Time of notification:  0165  Critical value read back:Yes.    Nurse who received alert:  Jule Ser, RN  MD notified (1st page):  Venora Maples  Time of first page:  64  MD notified (2nd page):  Time of second page:  Responding MD:  Venora Maples  Time MD responded:  5374

## 2014-12-04 NOTE — ED Provider Notes (Signed)
CSN: [id number]     Arrival date & time 12/04/14  1005 History  This chart was scribed for Jola Schmidt, MD by Eustaquio Maize, ED Scribe. This patient was seen in room APA02/APA02 and the patient's care was started at 10:36 AM.    Chief Complaint  Patient presents with  . Altered Mental Status   LEVEL 5 CAVEAT   The history is provided by the patient and the spouse Loss adjuster, chartered). No language interpreter was used.     HPI Comments: Diane Buchanan is a 44 y.o. female brought in by ambulance, with hx DM who presents to the Emergency Department for unresponsiveness. Per nurse, she was found laying next to a broken coffee table by family. Pt was given 4 mg Narcan on scene. Pt is now awake but not responding appropriately.. Pt admits to taking pain medication last night but will not say what she took. Husband mentions that he woke up this morning and was told by his son that pt was laying on the floor. Husband states that pt must have been sitting on the couch curling her hair due to finding the curling iron on the floor, which he reports was still hot to the touch. He states that pt was unresponsive and not breathing. Pt attempted CPR before calling EMS. He denies cyanosis.  Husband reports that pt seemed fine last night except for stating that she felt fatigued. Husband took pt's glucose level at that time and reports it was 600. He states she took her Metformin which dropped level down to 160. Husband states that pt is currently on Methadone, Xanax, and some type of muscle relaxer but is unsure of the name. He denies pt ever taking more than the recommended amount of Methadone. Husband states that he administers pt's medications to her.   Past Medical History  Diagnosis Date  . Diabetes mellitus without complication   . Cleft palate    Past Surgical History  Procedure Laterality Date  . Joint replacement    . Total hip arthroplasty    . Tubal ligation     History reviewed. No pertinent family  history. History  Substance Use Topics  . Smoking status: Current Every Day Smoker  . Smokeless tobacco: Not on file  . Alcohol Use: No   OB History    No data available     Review of Systems  Unable to perform ROS: Mental status change     Allergies  Review of patient's allergies indicates no known allergies.  Home Medications   Prior to Admission medications   Medication Sig Start Date End Date Taking? Authorizing Provider  ALPRAZolam Duanne Moron) 1 MG tablet Take 1 mg by mouth 4 (four) times daily as needed for anxiety.   Yes Historical Provider, MD  amphetamine-dextroamphetamine (ADDERALL) 20 MG tablet Take 10 mg by mouth 2 (two) times daily.   Yes Historical Provider, MD  cyclobenzaprine (FLEXERIL) 10 MG tablet Take 10 mg by mouth 3 (three) times daily as needed for muscle spasms.   Yes Historical Provider, MD  desvenlafaxine (PRISTIQ) 50 MG 24 hr tablet Take 50 mg by mouth daily.   Yes Historical Provider, MD  metFORMIN (GLUCOPHAGE) 500 MG tablet Take 500 mg by mouth 2 (two) times daily with a meal.   Yes Historical Provider, MD  methadone (DOLOPHINE) 10 MG tablet Take 10 mg by mouth 3 (three) times daily.   Yes Historical Provider, MD  pregabalin (LYRICA) 150 MG capsule Take 150 mg by mouth 3 (three)  times daily.   Yes Historical Provider, MD   Triage Vitals: BP 137/121 mmHg  Pulse 106  Temp(Src) 98.2 F (36.8 C) (Oral)  Resp 20  Ht 5\' 3"  (1.6 m)  Wt 188 lb (85.276 kg)  BMI 33.31 kg/m2  SpO2 98%   Physical Exam  Constitutional: She appears well-developed and well-nourished. No distress.  HENT:  Head: Normocephalic and atraumatic.  Eyes: EOM are normal.  Neck: Normal range of motion.  Cardiovascular: Normal rate, regular rhythm and normal heart sounds.   Pulmonary/Chest: Effort normal and breath sounds normal.  Abdominal: Soft. She exhibits no distension. There is no tenderness.  Musculoskeletal: Normal range of motion.  Full ROM in major joints No obvious  deformities or bruises   Neurological:  Opens eye to voice.  Follows very simple commands only Appears to move all 4 extremities    Skin: Skin is warm and dry.  Nursing note and vitals reviewed.   ED Course  Procedures (including critical care time)  DIAGNOSTIC STUDIES: Oxygen Saturation is 98% on RA, normal by my interpretation.    COORDINATION OF CARE: 10:42 AM-Discussed treatment plan which includes CT Head, Lactic acid, Venous blood gas, CBG, CMP, Acetaminophen level, Salicylate level, Rapid drug screen, Urine culture, UA, CBC   Labs Review Labs Reviewed  COMPREHENSIVE METABOLIC PANEL - Abnormal; Notable for the following:    Potassium 3.2 (*)    Glucose, Bld 159 (*)    Creatinine, Ser 0.43 (*)    AST 80 (*)    ALT 99 (*)    All other components within normal limits  ACETAMINOPHEN LEVEL - Abnormal; Notable for the following:    Acetaminophen (Tylenol), Serum <10 (*)    All other components within normal limits  URINE RAPID DRUG SCREEN, HOSP PERFORMED - Abnormal; Notable for the following:    Benzodiazepines POSITIVE (*)    Tetrahydrocannabinol POSITIVE (*)    All other components within normal limits  URINALYSIS, ROUTINE W REFLEX MICROSCOPIC (NOT AT [facility]) - Abnormal; Notable for the following:    Glucose, UA >1000 (*)    Leukocytes, UA TRACE (*)    All other components within normal limits  BLOOD GAS, VENOUS - Abnormal; Notable for the following:    pH, Ven 7.315 (*)    pCO2, Ven 57.8 (*)    pO2, Ven 46.8 (*)    Bicarbonate 28.5 (*)    Acid-Base Excess 2.9 (*)    All other components within normal limits  LACTIC ACID, PLASMA - Abnormal; Notable for the following:    Lactic Acid, Venous 2.3 (*)    All other components within normal limits  URINE MICROSCOPIC-ADD ON - Abnormal; Notable for the following:    Squamous Epithelial / LPF FEW (*)    All other components within normal limits  URINE CULTURE  CBC WITH DIFFERENTIAL/PLATELET  SALICYLATE LEVEL  CBG  MONITORING, ED    Imaging Review Ct Head Wo Contrast  12/04/2014   CLINICAL DATA:  Found unresponsive.  Altered mental status.  EXAM: CT HEAD WITHOUT CONTRAST  TECHNIQUE: Contiguous axial images were obtained from the base of the skull through the vertex without intravenous contrast.  COMPARISON:  None.  FINDINGS: No evidence of an acute infarct, acute hemorrhage, mass lesion, mass effect or hydrocephalus. Debris and air are seen in the nasopharynx. Nasal bones appear grossly intact. Difficult to exclude a nondisplaced fracture of the nasal septum in its midportion (series 3, image 2). Lamina papyracea appear intact bilaterally. Mucosal thickening in the  right sphenoid sinus. Left mastoid effusion. There may be trace fluid in the right mastoid air cells.  IMPRESSION: 1. No acute intracranial abnormality. 2. Difficult to exclude a nondisplaced fracture of the nasal septum. 3. Left mastoid effusion.  Possible trace right mastoid effusion.   Electronically Signed   By: Lorin Picket M.D.   On: 12/04/2014 12:19   I personally reviewed the imaging tests through PACS system I reviewed available ER/hospitalization records through the EMR    EKG Interpretation   Date/Time:  Monday December 04 2014 10:13:31 EDT Ventricular Rate:  92 PR Interval:  192 QRS Duration: 121 QT Interval:  407 QTC Calculation: 503 R Axis:   97 Text Interpretation:  Sinus rhythm Ventricular premature complex  Nonspecific intraventricular conduction delay Abnormal inferior Q waves No  old tracing to compare Confirmed by Lin Glazier  MD, Danice Dippolito ([id number]) on 12/04/2014  11:20:04 AM      MDM   Final diagnoses:  Narcotic overdose, accidental or unintentional, initial encounter   1:47 PM Patient is now awake and oriented.  She is alert.  She is eating and drinking.  She is in no acute distress.  Her vital signs are normal.  The patient was rolled over she was found to fit no patch on her buttock.  This is likely narcotic overdose  given her response to Narcan initially.  She is doing much better.  Discharge home in good condition.  This was an unintentional overdose.  The patient has been advised to not use the narcotics of others.  I personally performed the services described in this documentation, which was scribed in my presence. The recorded information has been reviewed and is accurate.        Jola Schmidt, MD 12/04/14 [phone]

## 2014-12-05 LAB — CBG MONITORING, ED: GLUCOSE-CAPILLARY: 146 mg/dL — AB (ref 65–99)

## 2014-12-06 LAB — URINE CULTURE
Colony Count: NO GROWTH
Culture: NO GROWTH

## 2014-12-23 ENCOUNTER — Emergency Department (HOSPITAL_COMMUNITY): Payer: Medicaid Other

## 2014-12-23 ENCOUNTER — Emergency Department (HOSPITAL_COMMUNITY)
Admission: EM | Admit: 2014-12-23 | Discharge: 2014-12-23 | Disposition: A | Discharge status: Home / Self Care | Payer: Medicaid Other | Attending: Emergency Medicine | Admitting: Emergency Medicine

## 2014-12-23 ENCOUNTER — Encounter (HOSPITAL_COMMUNITY): Payer: Self-pay

## 2014-12-23 DIAGNOSIS — E119 Type 2 diabetes mellitus without complications: Secondary | ICD-10-CM | POA: Insufficient documentation

## 2014-12-23 DIAGNOSIS — R4182 Altered mental status, unspecified: Secondary | ICD-10-CM | POA: Diagnosis present

## 2014-12-23 DIAGNOSIS — Q359 Cleft palate, unspecified: Secondary | ICD-10-CM | POA: Insufficient documentation

## 2014-12-23 DIAGNOSIS — Z720 Tobacco use: Secondary | ICD-10-CM | POA: Diagnosis not present

## 2014-12-23 DIAGNOSIS — R404 Transient alteration of awareness: Secondary | ICD-10-CM

## 2014-12-23 DIAGNOSIS — Z79899 Other long term (current) drug therapy: Secondary | ICD-10-CM | POA: Diagnosis not present

## 2014-12-23 DIAGNOSIS — T424X1A Poisoning by benzodiazepines, accidental (unintentional), initial encounter: Secondary | ICD-10-CM | POA: Diagnosis not present

## 2014-12-23 LAB — RAPID URINE DRUG SCREEN, HOSP PERFORMED
AMPHETAMINES: NOT DETECTED
Barbiturates: NOT DETECTED
Benzodiazepines: POSITIVE — AB
Cocaine: NOT DETECTED
OPIATES: NOT DETECTED
Tetrahydrocannabinol: POSITIVE — AB

## 2014-12-23 LAB — COMPREHENSIVE METABOLIC PANEL
ALT: 56 U/L — ABNORMAL HIGH (ref 14–54)
ANION GAP: 10 (ref 5–15)
AST: 54 U/L — ABNORMAL HIGH (ref 15–41)
Albumin: 4.3 g/dL (ref 3.5–5.0)
Alkaline Phosphatase: 101 U/L (ref 38–126)
BUN: 13 mg/dL (ref 6–20)
CHLORIDE: 96 mmol/L — AB (ref 101–111)
CO2: 28 mmol/L (ref 22–32)
Calcium: 9 mg/dL (ref 8.9–10.3)
Creatinine, Ser: 0.6 mg/dL (ref 0.44–1.00)
GFR calc non Af Amer: >60 mL/min (ref >60)
GLUCOSE: 283 mg/dL — AB (ref 65–99)
Potassium: 3.1 mmol/L — ABNORMAL LOW (ref 3.5–5.1)
SODIUM: 134 mmol/L — AB (ref 135–145)
TOTAL PROTEIN: 7.9 g/dL (ref 6.5–8.1)
Total Bilirubin: 0.4 mg/dL (ref 0.3–1.2)

## 2014-12-23 LAB — BLOOD GAS, VENOUS
Acid-Base Excess: 0.2 mmol/L (ref 0.0–2.0)
BICARBONATE: 26.6 meq/L — AB (ref 20.0–24.0)
FIO2: 0.21 %
O2 Content: 21 L/min
O2 Saturation: 56.5 %
PATIENT TEMPERATURE: 37
TCO2: 24.8 mmol/L (ref 0–100)
pCO2, Ven: 62.1 mmHg — ABNORMAL HIGH (ref 45.0–50.0)
pH, Ven: 7.255 (ref 7.250–7.300)
pO2, Ven: 34.1 mmHg (ref 30.0–45.0)

## 2014-12-23 LAB — URINALYSIS, ROUTINE W REFLEX MICROSCOPIC
Bilirubin Urine: NEGATIVE
Glucose, UA: >1000 mg/dL — AB
Leukocytes, UA: NEGATIVE
Nitrite: NEGATIVE
Protein, ur: 30 mg/dL — AB
Urobilinogen, UA: 0.2 mg/dL (ref 0.0–1.0)
pH: 5.5 (ref 5.0–8.0)

## 2014-12-23 LAB — CBC WITH DIFFERENTIAL/PLATELET
BASOS PCT: 0 % (ref 0–1)
Basophils Absolute: 0 10*3/uL (ref 0.0–0.1)
Eosinophils Absolute: 0.2 10*3/uL (ref 0.0–0.7)
Eosinophils Relative: 1 % (ref 0–5)
HCT: 38.6 % (ref 36.0–46.0)
Hemoglobin: 12.9 g/dL (ref 12.0–15.0)
Lymphocytes Relative: 37 % (ref 12–46)
Lymphs Abs: 5.2 10*3/uL — ABNORMAL HIGH (ref 0.7–4.0)
MCH: 29.7 pg (ref 26.0–34.0)
MCHC: 33.4 g/dL (ref 30.0–36.0)
MCV: 88.7 fL (ref 78.0–100.0)
Monocytes Absolute: 1 10*3/uL (ref 0.1–1.0)
Monocytes Relative: 7 % (ref 3–12)
NEUTROS ABS: 7.7 10*3/uL (ref 1.7–7.7)
Neutrophils Relative %: 55 % (ref 43–77)
Platelets: 218 10*3/uL (ref 150–400)
RBC: 4.35 MIL/uL (ref 3.87–5.11)
RDW: 13.1 % (ref 11.5–15.5)
WBC: 14.2 10*3/uL — AB (ref 4.0–10.5)

## 2014-12-23 LAB — URINE MICROSCOPIC-ADD ON

## 2014-12-23 LAB — ETHANOL: Alcohol, Ethyl (B): <5 mg/dL (ref <5)

## 2014-12-23 MED ORDER — SODIUM CHLORIDE 0.9 % IV SOLN
INTRAVENOUS | Status: DC
  Administered 2014-12-23: 10:00:00 via INTRAVENOUS

## 2014-12-23 MED ORDER — SODIUM CHLORIDE 0.9 % IV BOLUS (SEPSIS)
500.0000 mL | Freq: Once | INTRAVENOUS | Status: AC
  Administered 2014-12-23: 500 mL via INTRAVENOUS

## 2014-12-23 MED ORDER — ONDANSETRON HCL 4 MG/2ML IJ SOLN
4.0000 mg | Freq: Once | INTRAMUSCULAR | Status: AC
  Administered 2014-12-23: 4 mg via INTRAVENOUS
  Filled 2014-12-23: qty 2

## 2014-12-23 NOTE — ED Notes (Signed)
Officer returned call, states all pt's belongings were left at bedside-purse, 2 wallets and flip flops.

## 2014-12-23 NOTE — ED Notes (Signed)
RN returned call to pt husband, Daphney Hopke [phone]. Notified that pt is stable.

## 2014-12-23 NOTE — ED Notes (Signed)
Per EMS, pt found lying in a stairway in a fetal position. Bystanders stated pt  Has been doing unknown drugs. Pt was given Narcan 2 mg by EMS prior to arrival. EMS states pt became combative and pulled her IV out. Pt has vomitus on her face on arrival to ED.

## 2014-12-23 NOTE — ED Notes (Signed)
Pt states she wants to go smoke and does not want to wait for d/c paperwork. Pt signed d/c.

## 2014-12-23 NOTE — ED Notes (Signed)
Pt upset-states her medications and her phone are missing from her purse. RN informed pt that all belongings that were brought in with her are in purse or on counter. Attempted to call cell phone, straight to voice mail. RN called ccom to speak with officer who brought pt in. Awaiting return call.

## 2014-12-23 NOTE — ED Provider Notes (Signed)
CSN: [id number]     Arrival date & time 12/23/14  0950 History  This chart was scribed for Daleen Bo, MD by Peyton Bottoms, ED Scribe. This patient was seen in room APA16A/APA16A and the patient's care was started at 9:55 AM.   Chief Complaint  Patient presents with  . Drug Overdose   Patient is a 44 y.o. female presenting with Overdose. The history is provided by the EMS personnel and the police. No language interpreter was used.  Drug Overdose   LEVEL 5 CAVEAT: ALTERED MENTAL STATUS  HPI Comments: Diane Buchanan is a 43 y.o. female, with a PMHx of diabetes, brought in by EMS, who presents to the Emergency Department complaining of drug overdose onset earlier today. Per [place] [employer], pt was found at the top of the stairs of her apartment building in a fetal position, smelling the wall as she was dragging herself down the stairs. Pt was unresponsive at the time. EMS arrived to scene and gave pt Narcan after which pt regained consciousness. Pt removed an IV that was on her upon regaining consciousness. Fentanyl patch was found on pt, which has been removed since arrival to ED.   Past Medical History  Diagnosis Date  . Diabetes mellitus without complication   . Cleft palate    Past Surgical History  Procedure Laterality Date  . Joint replacement    . Total hip arthroplasty    . Tubal ligation     No family history on file. History  Substance Use Topics  . Smoking status: Current Every Day Smoker  . Smokeless tobacco: Not on file  . Alcohol Use: No   OB History    No data available     Review of Systems  Unable to perform ROS: Mental status change   Allergies  Review of patient's allergies indicates no known allergies.  Home Medications   Prior to Admission medications   Medication Sig Start Date End Date Taking? Authorizing Provider  ALPRAZolam Duanne Moron) 1 MG tablet Take 1 mg by mouth 4 (four) times daily as needed for anxiety.   Yes Historical Provider, MD   amphetamine-dextroamphetamine (ADDERALL) 20 MG tablet Take 10 mg by mouth 2 (two) times daily.   Yes Historical Provider, MD  cyclobenzaprine (FLEXERIL) 10 MG tablet Take 10 mg by mouth 3 (three) times daily as needed for muscle spasms.   Yes Historical Provider, MD  desvenlafaxine (PRISTIQ) 50 MG 24 hr tablet Take 50 mg by mouth daily.   Yes Historical Provider, MD  metFORMIN (GLUCOPHAGE) 500 MG tablet Take 500 mg by mouth 2 (two) times daily with a meal.   Yes Historical Provider, MD  methadone (DOLOPHINE) 10 MG tablet Take 10 mg by mouth 3 (three) times daily.   Yes Historical Provider, MD  pregabalin (LYRICA) 200 MG capsule Take 200 mg by mouth 3 (three) times daily.   Yes Historical Provider, MD   BP 120/80 mmHg  Pulse 101  Temp(Src) 97.8 F (36.6 C) (Rectal)  Resp 12  SpO2 92% Physical Exam  Constitutional: She appears well-developed and well-nourished.  Poor hygiene. Feet very dirty.  HENT:  Head: Normocephalic and atraumatic.  Eyes: Conjunctivae and EOM are normal. Pupils are equal, round, and reactive to light.  Pupils 20mm and reactive bilaterally.  Neck: Normal range of motion and phonation normal. Neck supple.  Cardiovascular: Normal rate and regular rhythm.   Pulmonary/Chest: Effort normal and breath sounds normal. She exhibits no tenderness.  Abdominal: Soft. She exhibits no distension.  There is no tenderness. There is no guarding.  Musculoskeletal: Normal range of motion.  Neurological: She exhibits normal muscle tone.  Responsive to pain bilaterally. Mumbles responses to verbal stimulation. Restless.  Skin: Skin is warm and dry.  Psychiatric:  Agitated.   Nursing note and vitals reviewed.  ED Course  Procedures (including critical care time)  Medications  0.9 %  sodium chloride infusion ( Intravenous New Bag/Given 12/23/14 1014)  ondansetron (ZOFRAN) injection 4 mg (4 mg Intravenous Given 12/23/14 1014)  sodium chloride 0.9 % bolus 500 mL (0 mLs Intravenous Stopped  12/23/14 1056)    Patient Vitals for the past 24 hrs:  BP Temp Temp src Pulse Resp SpO2  12/23/14 1541 - 97.8 F (36.6 C) - - - -  12/23/14 1530 120/80 mmHg - - 101 12 92 %  12/23/14 1400 101/66 mmHg - - 90 10 100 %  12/23/14 1330 102/66 mmHg - - 90 11 100 %  12/23/14 1300 106/64 mmHg - - 91 11 100 %  12/23/14 1234 115/66 mmHg - - 97 19 100 %  12/23/14 1200 111/61 mmHg - - 91 10 100 %  12/23/14 1130 101/62 mmHg - - 93 10 100 %  12/23/14 1100 119/62 mmHg - - 91 10 100 %  12/23/14 1030 128/83 mmHg - - 89 14 91 %  12/23/14 1019 134/81 mmHg - - 91 14 94 %  12/23/14 1010 (!) 133/101 mmHg 98.7 F (37.1 C) Rectal 108 24 98 %    3:25 PM Reevaluation with update and discussion. After initial assessment and treatment, an updated evaluation reveals she is now alert, calm and cooperative. She states was "up all night, then can't remember what happened." She denies problems at this time. He is tolerating oral liquids easily. She speaks with a nasal tone to her voice. 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 She is able to ambulate easily. Findings discussed with patient, all questions answered. Marguerette Sheller L   CRITICAL CARE Performed by: Daleen Bo L Total critical care time: 45 minutes Critical care time was exclusive of separately billable procedures and treating other patients. Critical care was necessary to treat or prevent imminent or life-threatening deterioration. Critical care was time spent personally by me on the following activities: development of treatment plan with patient and/or surrogate as well as nursing, discussions with consultants, evaluation of patient's response to treatment, examination of patient, obtaining history from patient or surrogate, ordering and performing treatments and interventions, ordering and review of laboratory studies, ordering and review of radiographic studies, pulse oximetry and re-evaluation of  patient's condition.     COORDINATION OF CARE: 10:03 AM- Discussed plans to order diagnostic CXR, EKG and lab work. Will give pt Zofran and IV fluids. Pt advised of plan for treatment and pt agrees.  Labs Review Labs Reviewed  COMPREHENSIVE METABOLIC PANEL - Abnormal; Notable for the following:    Sodium 134 (*)    Potassium 3.1 (*)    Chloride 96 (*)    Glucose, Bld 283 (*)    AST 54 (*)    ALT 56 (*)    All other components within normal limits  CBC WITH DIFFERENTIAL/PLATELET - Abnormal; Notable for the following:    WBC 14.2 (*)    Lymphs Abs 5.2 (*)    All other components within normal limits  URINALYSIS, ROUTINE W REFLEX MICROSCOPIC (NOT AT [facility]) - Abnormal; Notable for the following:    APPearance  HAZY (*)    Specific Gravity, Urine >1.030 (*)    Glucose, UA >1000 (*)    Hgb urine dipstick SMALL (*)    Ketones, ur TRACE (*)    Protein, ur 30 (*)    All other components within normal limits  URINE RAPID DRUG SCREEN, HOSP PERFORMED - Abnormal; Notable for the following:    Benzodiazepines POSITIVE (*)    Tetrahydrocannabinol POSITIVE (*)    All other components within normal limits  BLOOD GAS, VENOUS - Abnormal; Notable for the following:    pCO2, Ven 62.1 (*)    Bicarbonate 26.6 (*)    All other components within normal limits  URINE MICROSCOPIC-ADD ON - Abnormal; Notable for the following:    Squamous Epithelial / LPF FEW (*)    Bacteria, UA MANY (*)    All other components within normal limits  ETHANOL   Imaging Review Dg Chest Port 1 View  12/23/2014   CLINICAL DATA:  Drug overdose, nonresponsive.  Found down.  EXAM: PORTABLE CHEST - 1 VIEW  COMPARISON:  12/04/2014  FINDINGS: There is vascular congestion and mild interstitial opacities, cannot exclude interstitial edema. Perihilar and basilar opacities likely reflect edema and/or atelectasis. Heart is normal size. No effusions or pneumothorax. No acute bony abnormality.  IMPRESSION: Perihilar and bibasilar  atelectasis or edema. Interstitial process likely reflects interstitial edema. Low lung volumes.   Electronically Signed   By: Rolm Baptise M.D.   On: 12/23/2014 11:13     EKG Interpretation   Date/Time:  Saturday December 23 2014 09:59:36 EDT Ventricular Rate:  91 PR Interval:  173 QRS Duration: 122 QT Interval:  385 QTC Calculation: 474 R Axis:   97 Text Interpretation:  Sinus rhythm Nonspecific intraventricular conduction  delay since last tracing no significant change Confirmed by Raia Amico  MD,  Shaurya Rawdon ([id number]) on 12/23/2014 10:52:11 AM      MDM   Final diagnoses:  Transient alteration of awareness    Nonspecific ultimately mental status, possibly related to an unintentional overdose of benzodiazepine. No evidence for acute psychiatric syndrome, metabolic instability or encephalopathy.  Nursing Notes Reviewed/ Care Coordinated Applicable Imaging Reviewed Interpretation of Laboratory Data incorporated into ED treatment  The patient appears reasonably screened and/or stabilized for discharge and I doubt any other medical condition or other [facility] requiring further screening, evaluation, or treatment in the ED at this time prior to discharge.  Plan: Home Medications- usual; Home Treatments- rest; return here if the recommended treatment, does not improve the symptoms; Recommended follow up- PCP prn   I personally performed the services described in this documentation, which was scribed in my presence. The recorded information has been reviewed and is accurate.   Daleen Bo, MD 12/23/14 1640

## 2014-12-23 NOTE — ED Notes (Signed)
RR-12bpm, shallow, O2 saturation 91% on ra. Pt placed on O2 at 2l-O2 saturation 100%. Pt responds to sternal rub and coughs. EDP aware.

## 2014-12-23 NOTE — ED Notes (Signed)
Pt ambulated to and from restroom independently. Steady gait.

## 2014-12-23 NOTE — ED Notes (Addendum)
RN at bedside since pt arrival ED.  MD at bedside upon arrival.  140mcg fentanyl patch removed from right buttock by Wyatt Haste NT and Stormy Fabian RN

## 2015-05-28 ENCOUNTER — Ambulatory Visit (HOSPITAL_COMMUNITY)
Admission: RE | Admit: 2015-05-28 | Discharge: 2015-05-28 | Disposition: A | Discharge status: Home / Self Care | Payer: Medicaid Other | Source: Ambulatory Visit | Attending: Family Medicine | Admitting: Family Medicine

## 2015-05-28 ENCOUNTER — Other Ambulatory Visit (HOSPITAL_COMMUNITY): Payer: Self-pay | Admitting: Family Medicine

## 2015-05-28 DIAGNOSIS — M25551 Pain in right hip: Secondary | ICD-10-CM

## 2015-07-30 ENCOUNTER — Ambulatory Visit (HOSPITAL_COMMUNITY)
Admission: RE | Admit: 2015-07-30 | Discharge: 2015-07-30 | Disposition: A | Discharge status: Home / Self Care | Payer: Medicaid Other | Source: Ambulatory Visit | Attending: Family Medicine | Admitting: Family Medicine

## 2015-07-30 ENCOUNTER — Other Ambulatory Visit (HOSPITAL_COMMUNITY): Payer: Self-pay | Admitting: Family Medicine

## 2015-07-30 DIAGNOSIS — R937 Abnormal findings on diagnostic imaging of other parts of musculoskeletal system: Secondary | ICD-10-CM | POA: Diagnosis not present

## 2015-07-30 DIAGNOSIS — M1712 Unilateral primary osteoarthritis, left knee: Secondary | ICD-10-CM

## 2015-07-30 DIAGNOSIS — Z96641 Presence of right artificial hip joint: Secondary | ICD-10-CM | POA: Insufficient documentation

## 2015-07-30 DIAGNOSIS — M25562 Pain in left knee: Secondary | ICD-10-CM | POA: Diagnosis present

## 2015-07-30 DIAGNOSIS — M25552 Pain in left hip: Secondary | ICD-10-CM | POA: Insufficient documentation

## 2015-07-30 DIAGNOSIS — M1612 Unilateral primary osteoarthritis, left hip: Secondary | ICD-10-CM

## 2015-12-05 ENCOUNTER — Encounter: Payer: Self-pay | Admitting: Obstetrics and Gynecology

## 2015-12-05 ENCOUNTER — Ambulatory Visit (INDEPENDENT_AMBULATORY_CARE_PROVIDER_SITE_OTHER): Payer: Medicaid Other | Admitting: Obstetrics and Gynecology

## 2015-12-05 ENCOUNTER — Other Ambulatory Visit (HOSPITAL_COMMUNITY)
Admission: RE | Admit: 2015-12-05 | Discharge: 2015-12-05 | Disposition: A | Discharge status: Home / Self Care | Payer: Medicaid Other | Source: Ambulatory Visit | Attending: Obstetrics and Gynecology | Admitting: Obstetrics and Gynecology

## 2015-12-05 VITALS — BP 110/70 | Ht 63.0 in | Wt 161.5 lb

## 2015-12-05 DIAGNOSIS — Z113 Encounter for screening for infections with a predominantly sexual mode of transmission: Secondary | ICD-10-CM | POA: Insufficient documentation

## 2015-12-05 DIAGNOSIS — Z1151 Encounter for screening for human papillomavirus (HPV): Secondary | ICD-10-CM | POA: Diagnosis not present

## 2015-12-05 DIAGNOSIS — Z01419 Encounter for gynecological examination (general) (routine) without abnormal findings: Secondary | ICD-10-CM | POA: Insufficient documentation

## 2015-12-05 DIAGNOSIS — Z0000 Encounter for general adult medical examination without abnormal findings: Secondary | ICD-10-CM

## 2015-12-05 DIAGNOSIS — L689 Hypertrichosis, unspecified: Secondary | ICD-10-CM

## 2015-12-05 MED ORDER — MICONAZOLE NITRATE 2 % VA CREA: 1.0000 | TOPICAL_CREAM | Freq: Every day | VAGINAL | Status: DC

## 2015-12-05 NOTE — Progress Notes (Signed)
Patient ID: Diane Buchanan, female   DOB: 1971/02/02, 45 y.o.   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 pap smear done, next pap due 3 years 2. return annually or prn 3    Annual mammogram advised 4    Will rx monistat cream 5    Will rx testosterone serum level Subjective:  Diane Buchanan is a 45 y.o. female No obstetric history on file. who presents for annual exam. Patient's last menstrual period was 11/26/2015. The patient has complaints today of excess facial hair, which she notes runs in her family. She shaves every other day. Last pap smear 11 years ago. Pt also expresses concern for bed bugs because she has a new kitten.  The following portions of the patient's history were reviewed and updated as appropriate: allergies, current medications, past family history, past medical history, past social history, past surgical history and problem list. Past Medical History  Diagnosis Date  . Diabetes mellitus without complication ([place])   . Cleft palate   . Anxiety   . Depression   . Asthma   . Cancer ([place])     cervical cancer in 1995, freezing procedure was done.   Marland Kitchen GERD (gastroesophageal reflux disease)     Past Surgical History  Procedure Laterality Date  . Joint replacement    . Total hip arthroplasty    . Tubal ligation    . Mouth surgery      surgery for cleft palete.   . Cesarean section       Current outpatient prescriptions:  .  ALPRAZolam (XANAX) 1 MG tablet, Take 1 mg by mouth 4 (four) times daily as needed for anxiety., Disp: , Rfl:  .  amphetamine-dextroamphetamine (ADDERALL) 20 MG tablet, Take 10 mg by mouth 2 (two) times daily., Disp: , Rfl:  .  cyclobenzaprine (FLEXERIL) 10 MG tablet, Take 10 mg by mouth 3 (three) times daily as needed for muscle spasms., Disp: , Rfl:  .  desvenlafaxine (PRISTIQ) 50 MG 24 hr tablet, Take 50 mg by mouth  daily., Disp: , Rfl:  .  metFORMIN (GLUCOPHAGE) 500 MG tablet, Take 500 mg by mouth 2 (two) times daily with a meal., Disp: , Rfl:  .  methadone (DOLOPHINE) 10 MG tablet, Take 10 mg by mouth 3 (three) times daily., Disp: , Rfl:  .  pregabalin (LYRICA) 200 MG capsule, Take 200 mg by mouth 3 (three) times daily., Disp: , Rfl:   Review of Systems Constitutional: negative Gastrointestinal: negative Genitourinary: negative Skin: Facial Hair  Objective:  BP 110/70 mmHg  Ht 5\' 3"  (1.6 m)  Wt 161 lb 8 oz (73.256 kg)  BMI 28.62 kg/m2  LMP 11/26/2015   BMI: Body mass index is 28.62 kg/(m^2).  General Appearance: Alert, appropriate appearance for age. No acute distress HEENT: Grossly normal Neck / Thyroid:  Cardiovascular: RRR; normal S1, S2, no murmur Lungs: CTA bilaterally Back: No CVAT Breast Exam: Normal to inspection and No masses or nodes. No dimpling, nipple retraction or discharge. Gastrointestinal: Soft, non-tender, no masses or organomegaly Pelvic Exam:  External genitalia: normal general appearance Vaginal: normal mucosa without prolapse or lesions, normal without tenderness, induration or masses and normal rugae Cervix: normal appearance Adnexa: normal bimanual exam Uterus: normal single, nontender Rectal: good sphincter tone, good support, no masses and guaiac negative, no visible or palpable hemorrhoids  Lymphatic Exam: Non-palpable nodes in neck, clavicular, axillary, or inguinal regions  Skin: no rash or abnormalities Neurologic: Normal gait and speech, no tremor  Psychiatric: Alert and oriented, appropriate affect.  Urinalysis:Not done  Mallory Shirk. MD Pgr [phone] 9:08 AM    By signing my name below, I, Rowan Blase, attest that this documentation has been prepared under the direction and in the presence of Jonnie Kind, MD . Electronically Signed: Rowan Blase, Scribe. 12/05/2015. 9:08 AM.  I personally performed the services described in this  documentation, which was SCRIBED in my presence. The recorded information has been reviewed and considered accurate. It has been edited as necessary during review. Jonnie Kind, MD

## 2015-12-05 NOTE — Addendum Note (Signed)
Addended by: Farley Ly on: 12/05/2015 09:34 AM   Modules accepted: Orders

## 2015-12-05 NOTE — Progress Notes (Signed)
Patient ID: Diane Buchanan, female   DOB: [birth year], 45 y.o.   MRN: [id number] Pt here today for annual exam. Pt states that she would like Dr. Glo Herring to look over her and make sure that she is in good health. Pt is very nervious and anxious about this visit .

## 2015-12-06 LAB — CYTOLOGY - PAP

## 2015-12-07 ENCOUNTER — Other Ambulatory Visit: Payer: Self-pay | Admitting: Obstetrics and Gynecology

## 2015-12-07 DIAGNOSIS — A5901 Trichomonal vulvovaginitis: Secondary | ICD-10-CM

## 2015-12-07 MED ORDER — METRONIDAZOLE 500 MG PO TABS: 2000.0000 mg | ORAL_TABLET | Freq: Once | ORAL | Status: DC

## 2015-12-11 ENCOUNTER — Telehealth: Payer: Self-pay | Admitting: *Deleted

## 2015-12-12 ENCOUNTER — Telehealth: Payer: Self-pay | Admitting: Cardiology

## 2015-12-12 NOTE — Telephone Encounter (Signed)
Pt aware of how to take antibiotic that was given for the Trich. Pt aware that she is to take four pills at one time and her partner takes four pills at one time. Pt verbalized understanding.

## 2016-01-31 IMAGING — CR DG CHEST 1V PORT
1 series · 1 of 1 positions shown · non-contrast
Comparison: 12/04/2014

CLINICAL DATA: Drug overdose, nonresponsive.  Found down.

EXAM:
PORTABLE CHEST - 1 VIEW

[ap portable]
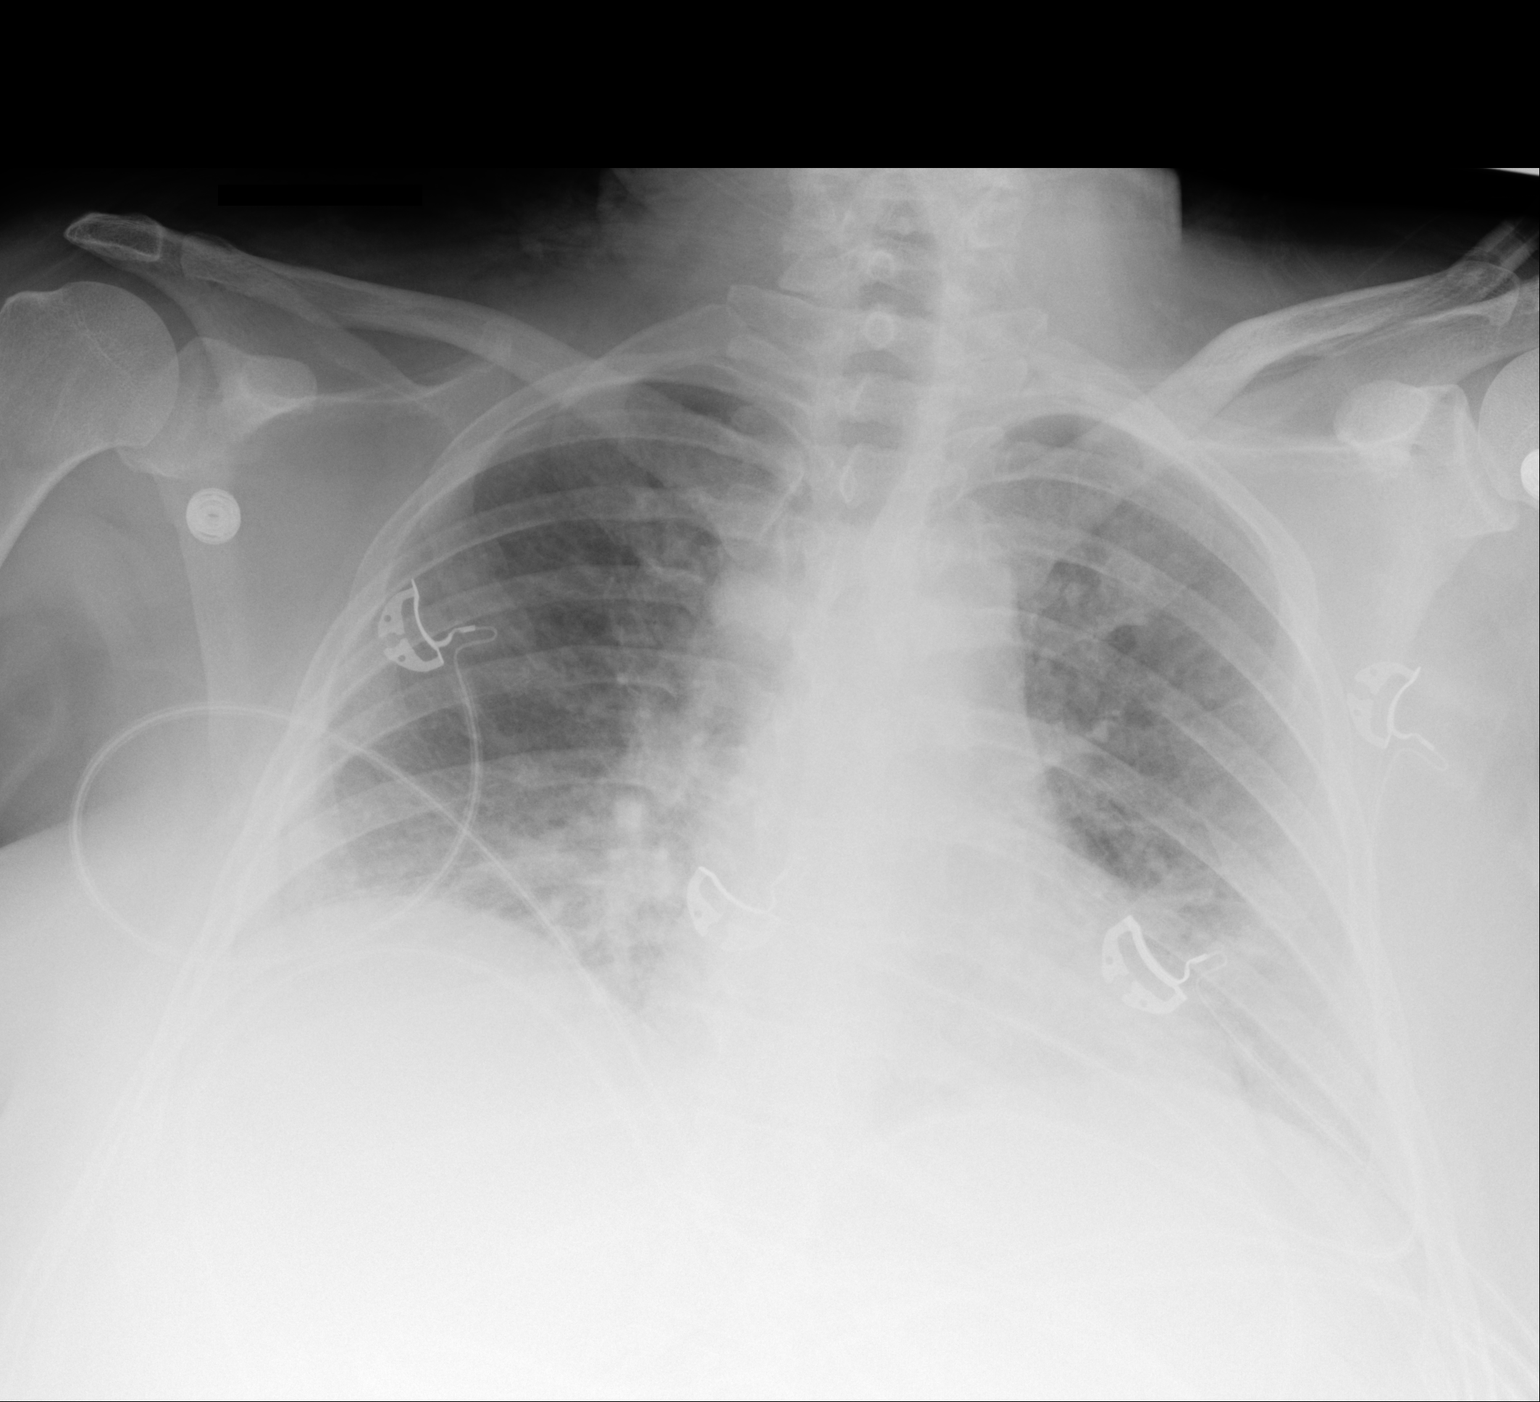

[1 of 1 positions shown; findings below may reference images not displayed]

FINDINGS: There is vascular congestion and mild interstitial opacities, cannot
exclude interstitial edema. Perihilar and basilar opacities likely
reflect edema and/or atelectasis. Heart is normal size. No effusions
or pneumothorax. No acute bony abnormality.
IMPRESSION: Perihilar and bibasilar atelectasis or edema. Interstitial process
likely reflects interstitial edema. Low lung volumes.

## 2016-07-05 IMAGING — DX DG HIP (WITH OR WITHOUT PELVIS) 2-3V*R*
3 series · 3 of 3 positions shown · non-contrast
Comparison: September 09, 2009

CLINICAL DATA: Right hip pain for several weeks

EXAM:
DG HIP (WITH OR WITHOUT PELVIS) 2-3V RIGHT

[pelvis ap]
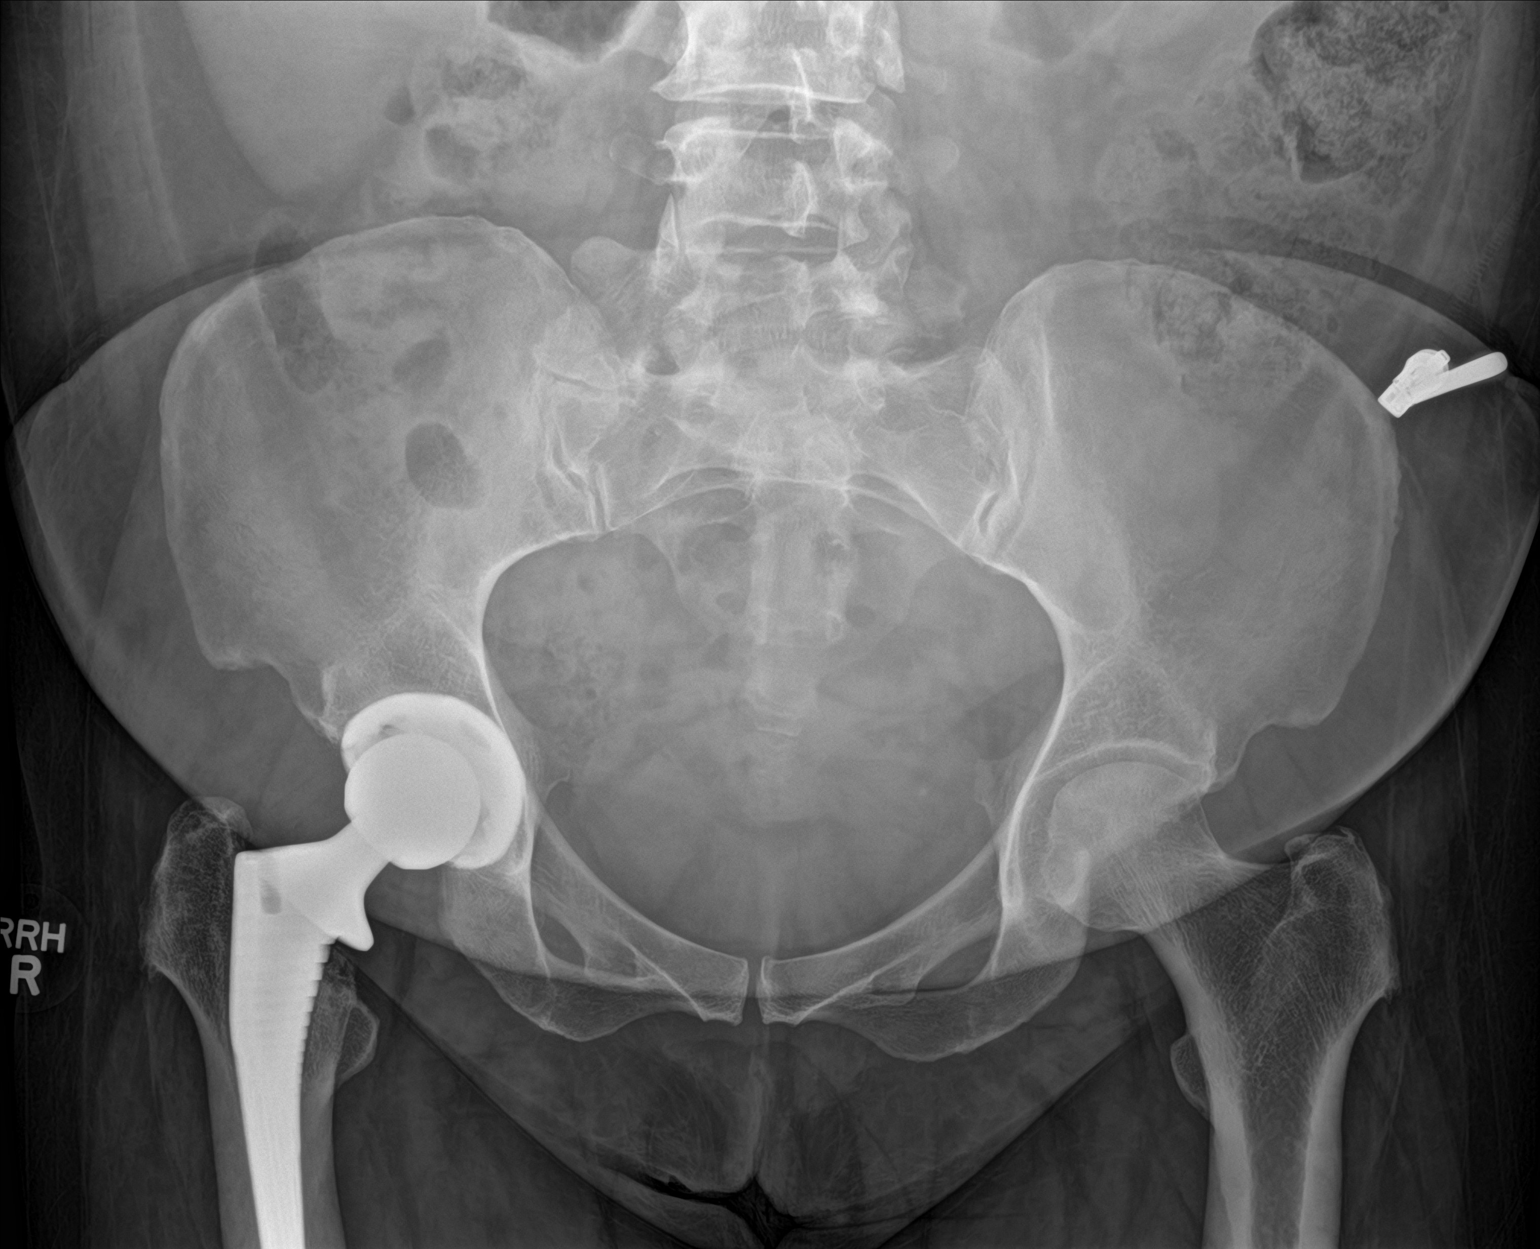

[hip ap]
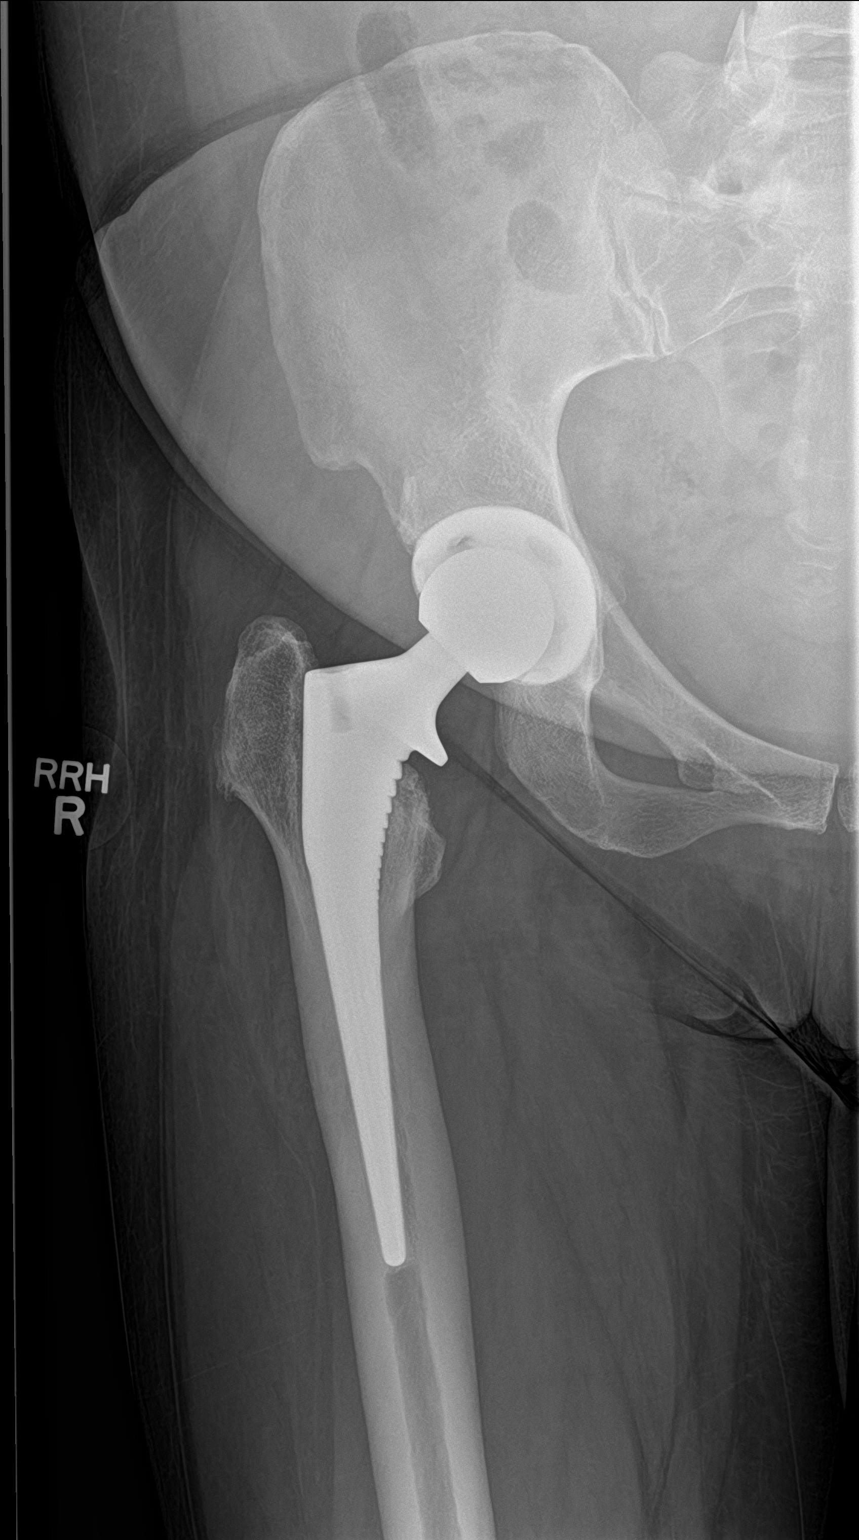

[hip frog leg]
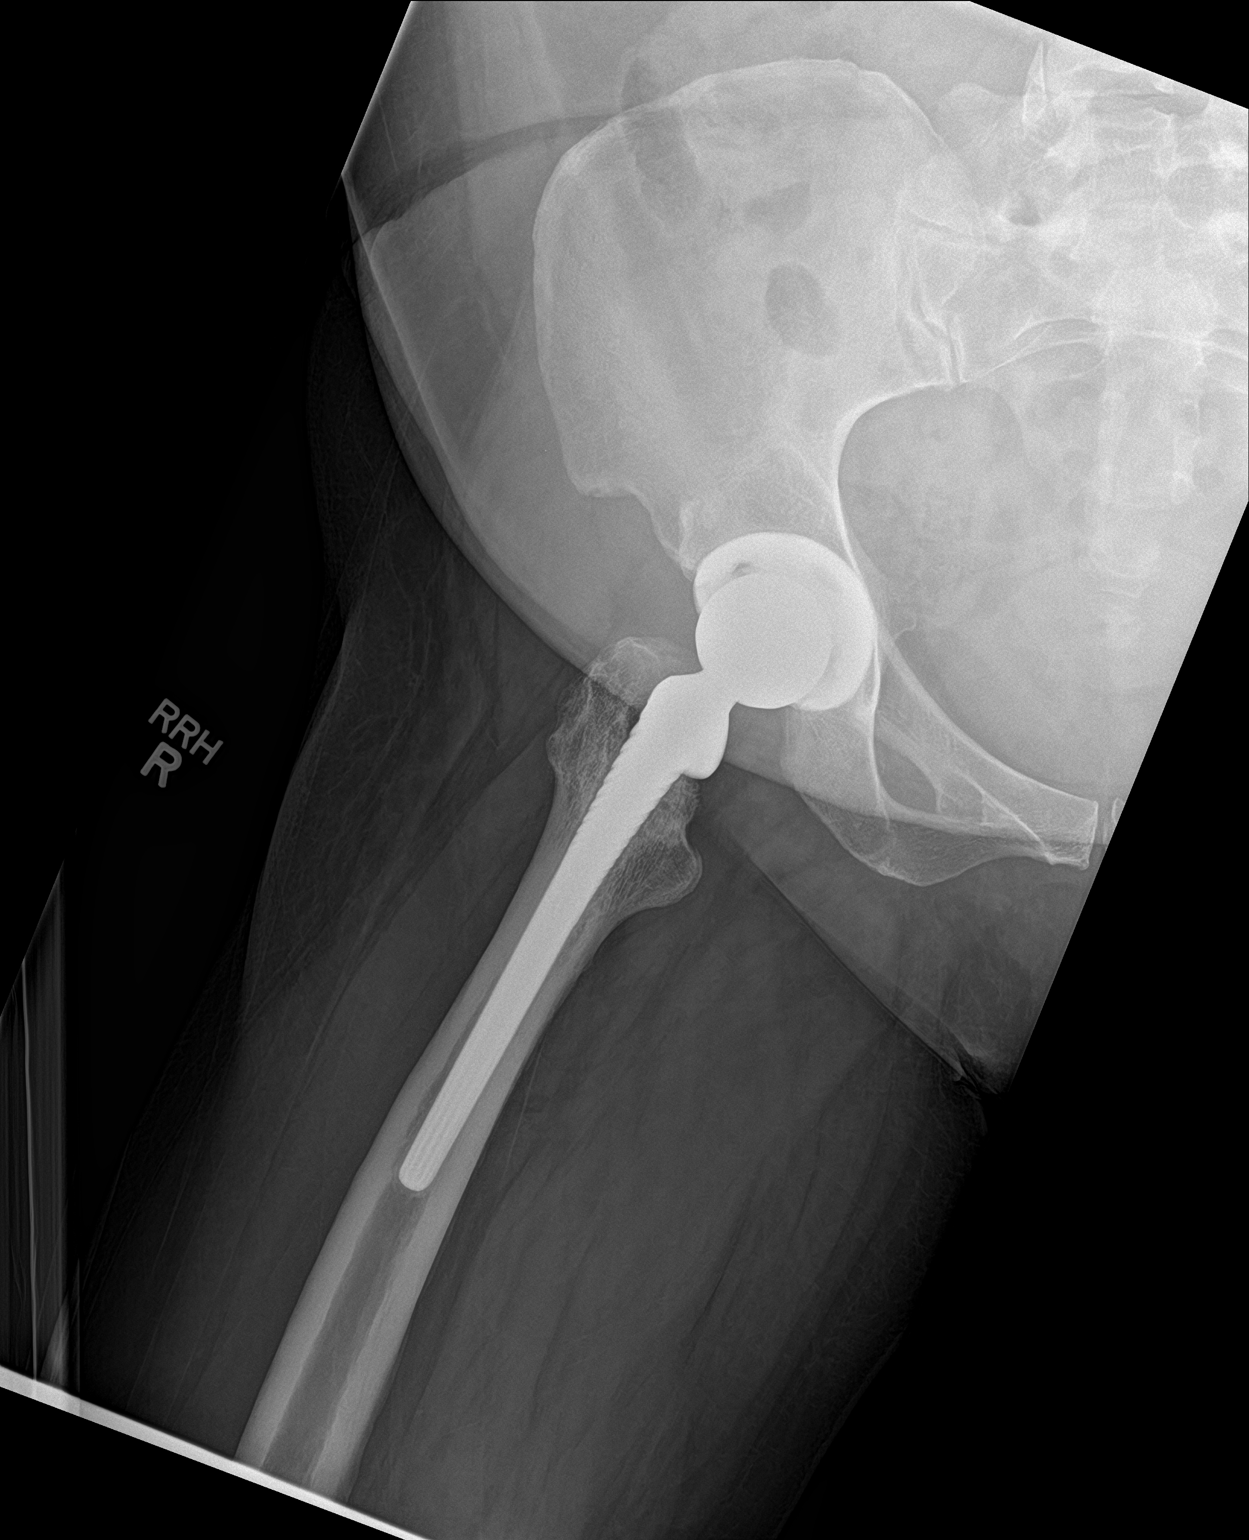

[3 of 3 positions shown; findings below may reference images not displayed]

FINDINGS: Frontal pelvis as well as frontal lateral right hip images were
obtained. There is a total hip prosthesis on the right with
prosthetic components appearing well-seated. No acute fracture or
dislocation. Left hip joint appears unremarkable. No erosive
changes.
IMPRESSION: Status post right total hip replacement with prosthetic components
appearing well-seated. No fracture or dislocation. Left hip joint
appears unremarkable and stable.

## 2017-05-28 ENCOUNTER — Ambulatory Visit: Payer: Self-pay | Admitting: Women's Health

## 2017-09-15 ENCOUNTER — Emergency Department (HOSPITAL_COMMUNITY): Payer: Medicaid Other

## 2017-09-15 ENCOUNTER — Other Ambulatory Visit: Payer: Self-pay

## 2017-09-15 ENCOUNTER — Observation Stay (HOSPITAL_COMMUNITY)
Admission: EM | Admit: 2017-09-15 | Discharge: 2017-09-18 | Disposition: A | Discharge status: Home / Self Care | Payer: Medicaid Other | Attending: Family Medicine | Admitting: Family Medicine

## 2017-09-15 ENCOUNTER — Encounter (HOSPITAL_COMMUNITY): Payer: Self-pay | Admitting: Internal Medicine

## 2017-09-15 DIAGNOSIS — F17210 Nicotine dependence, cigarettes, uncomplicated: Secondary | ICD-10-CM | POA: Insufficient documentation

## 2017-09-15 DIAGNOSIS — F329 Major depressive disorder, single episode, unspecified: Secondary | ICD-10-CM | POA: Insufficient documentation

## 2017-09-15 DIAGNOSIS — Z96649 Presence of unspecified artificial hip joint: Secondary | ICD-10-CM | POA: Insufficient documentation

## 2017-09-15 DIAGNOSIS — Z7984 Long term (current) use of oral hypoglycemic drugs: Secondary | ICD-10-CM | POA: Diagnosis not present

## 2017-09-15 DIAGNOSIS — E119 Type 2 diabetes mellitus without complications: Secondary | ICD-10-CM | POA: Diagnosis not present

## 2017-09-15 DIAGNOSIS — Z720 Tobacco use: Secondary | ICD-10-CM | POA: Diagnosis present

## 2017-09-15 DIAGNOSIS — E860 Dehydration: Secondary | ICD-10-CM

## 2017-09-15 DIAGNOSIS — I959 Hypotension, unspecified: Secondary | ICD-10-CM | POA: Diagnosis not present

## 2017-09-15 DIAGNOSIS — R404 Transient alteration of awareness: Secondary | ICD-10-CM

## 2017-09-15 DIAGNOSIS — R6889 Other general symptoms and signs: Secondary | ICD-10-CM

## 2017-09-15 DIAGNOSIS — F411 Generalized anxiety disorder: Secondary | ICD-10-CM | POA: Insufficient documentation

## 2017-09-15 DIAGNOSIS — F1910 Other psychoactive substance abuse, uncomplicated: Secondary | ICD-10-CM | POA: Diagnosis not present

## 2017-09-15 DIAGNOSIS — F32A Depression, unspecified: Secondary | ICD-10-CM | POA: Diagnosis present

## 2017-09-15 DIAGNOSIS — I952 Hypotension due to drugs: Secondary | ICD-10-CM

## 2017-09-15 DIAGNOSIS — Z79899 Other long term (current) drug therapy: Secondary | ICD-10-CM

## 2017-09-15 DIAGNOSIS — K219 Gastro-esophageal reflux disease without esophagitis: Secondary | ICD-10-CM | POA: Diagnosis not present

## 2017-09-15 DIAGNOSIS — G894 Chronic pain syndrome: Secondary | ICD-10-CM

## 2017-09-15 DIAGNOSIS — J45909 Unspecified asthma, uncomplicated: Secondary | ICD-10-CM | POA: Diagnosis not present

## 2017-09-15 DIAGNOSIS — R4182 Altered mental status, unspecified: Secondary | ICD-10-CM | POA: Diagnosis present

## 2017-09-15 DIAGNOSIS — G8929 Other chronic pain: Secondary | ICD-10-CM | POA: Diagnosis not present

## 2017-09-15 LAB — URINALYSIS, ROUTINE W REFLEX MICROSCOPIC
Bilirubin Urine: NEGATIVE
GLUCOSE, UA: 150 mg/dL — AB
Hgb urine dipstick: NEGATIVE
Ketones, ur: NEGATIVE mg/dL
Nitrite: NEGATIVE
PROTEIN: NEGATIVE mg/dL
Specific Gravity, Urine: 1.013 (ref 1.005–1.030)
pH: 5 (ref 5.0–8.0)

## 2017-09-15 LAB — CBC WITH DIFFERENTIAL/PLATELET
BASOS PCT: 0 %
Basophils Absolute: 0 10*3/uL (ref 0.0–0.1)
Eosinophils Absolute: 0.1 10*3/uL (ref 0.0–0.7)
Eosinophils Relative: 1 %
HEMATOCRIT: 37.4 % (ref 36.0–46.0)
HEMOGLOBIN: 11.7 g/dL — AB (ref 12.0–15.0)
LYMPHS ABS: 3.6 10*3/uL (ref 0.7–4.0)
LYMPHS PCT: 29 %
MCH: 27.5 pg (ref 26.0–34.0)
MCHC: 31.3 g/dL (ref 30.0–36.0)
MCV: 87.8 fL (ref 78.0–100.0)
MONOS PCT: 7 %
Monocytes Absolute: 0.9 10*3/uL (ref 0.1–1.0)
NEUTROS PCT: 63 %
Neutro Abs: 7.8 10*3/uL — ABNORMAL HIGH (ref 1.7–7.7)
Platelets: 267 10*3/uL (ref 150–400)
RBC: 4.26 MIL/uL (ref 3.87–5.11)
RDW: 14.7 % (ref 11.5–15.5)
WBC: 12.4 10*3/uL — ABNORMAL HIGH (ref 4.0–10.5)

## 2017-09-15 LAB — PHOSPHORUS: PHOSPHORUS: 4.7 mg/dL — AB (ref 2.5–4.6)

## 2017-09-15 LAB — COMPREHENSIVE METABOLIC PANEL
ALBUMIN: 4.1 g/dL (ref 3.5–5.0)
ALT: 15 U/L (ref 14–54)
AST: 18 U/L (ref 15–41)
Alkaline Phosphatase: 82 U/L (ref 38–126)
Anion gap: 12 (ref 5–15)
BUN: 16 mg/dL (ref 6–20)
CHLORIDE: 100 mmol/L — AB (ref 101–111)
CO2: 26 mmol/L (ref 22–32)
Calcium: 9 mg/dL (ref 8.9–10.3)
Creatinine, Ser: 0.65 mg/dL (ref 0.44–1.00)
GFR calc non Af Amer: >60 mL/min (ref >60)
GLUCOSE: 75 mg/dL (ref 65–99)
POTASSIUM: 3 mmol/L — AB (ref 3.5–5.1)
SODIUM: 138 mmol/L (ref 135–145)
Total Bilirubin: 0.6 mg/dL (ref 0.3–1.2)
Total Protein: 7.8 g/dL (ref 6.5–8.1)

## 2017-09-15 LAB — RAPID URINE DRUG SCREEN, HOSP PERFORMED
AMPHETAMINES: NOT DETECTED
Barbiturates: NOT DETECTED
Benzodiazepines: POSITIVE — AB
Cocaine: POSITIVE — AB
Opiates: NOT DETECTED
TETRAHYDROCANNABINOL: POSITIVE — AB

## 2017-09-15 LAB — ACETAMINOPHEN LEVEL

## 2017-09-15 LAB — GLUCOSE, CAPILLARY: GLUCOSE-CAPILLARY: 250 mg/dL — AB (ref 65–99)

## 2017-09-15 LAB — HEMOGLOBIN A1C
Hgb A1c MFr Bld: 6.1 % — ABNORMAL HIGH (ref 4.8–5.6)
MEAN PLASMA GLUCOSE: 128.37 mg/dL

## 2017-09-15 LAB — TSH: TSH: 1.995 u[IU]/mL (ref 0.350–4.500)

## 2017-09-15 LAB — CBG MONITORING, ED: GLUCOSE-CAPILLARY: 86 mg/dL (ref 65–99)

## 2017-09-15 LAB — ETHANOL

## 2017-09-15 LAB — AMMONIA: Ammonia: 17 umol/L (ref 9–35)

## 2017-09-15 LAB — MAGNESIUM: Magnesium: 2.2 mg/dL (ref 1.7–2.4)

## 2017-09-15 LAB — PREGNANCY, URINE: Preg Test, Ur: NEGATIVE

## 2017-09-15 MED ORDER — SODIUM CHLORIDE 0.9 % IV SOLN: INTRAVENOUS | Status: DC

## 2017-09-15 MED ORDER — ONDANSETRON HCL 4 MG PO TABS: 4.0000 mg | ORAL_TABLET | Freq: Once | ORAL | Status: DC

## 2017-09-15 MED ORDER — INSULIN ASPART 100 UNIT/ML ~~LOC~~ SOLN
0.0000 [IU] | Freq: Three times a day (TID) | SUBCUTANEOUS | Status: DC
  Administered 2017-09-18: 9 [IU] via SUBCUTANEOUS

## 2017-09-15 MED ORDER — ONDANSETRON HCL 4 MG/2ML IJ SOLN: 4.0000 mg | Freq: Four times a day (QID) | INTRAMUSCULAR | Status: DC | PRN

## 2017-09-15 MED ORDER — PREGABALIN 75 MG PO CAPS
200.0000 mg | ORAL_CAPSULE | Freq: Three times a day (TID) | ORAL | Status: DC
  Administered 2017-09-16 – 2017-09-18 (×7): 200 mg via ORAL
  Filled 2017-09-15: qty 1
  Filled 2017-09-15: qty 2
  Filled 2017-09-15: qty 1
  Filled 2017-09-15: qty 2
  Filled 2017-09-15 (×4): qty 1

## 2017-09-15 MED ORDER — DESVENLAFAXINE SUCCINATE ER 50 MG PO TB24
50.0000 mg | ORAL_TABLET | Freq: Every day | ORAL | Status: DC
  Administered 2017-09-16 – 2017-09-18 (×3): 50 mg via ORAL
  Filled 2017-09-15 (×6): qty 1

## 2017-09-15 MED ORDER — POTASSIUM CHLORIDE CRYS ER 20 MEQ PO TBCR: 40.0000 meq | EXTENDED_RELEASE_TABLET | Freq: Once | ORAL | Status: DC

## 2017-09-15 MED ORDER — AMPHETAMINE-DEXTROAMPHETAMINE 10 MG PO TABS
10.0000 mg | ORAL_TABLET | Freq: Two times a day (BID) | ORAL | Status: DC
  Administered 2017-09-16 (×2): 10 mg via ORAL
  Filled 2017-09-15 (×2): qty 1

## 2017-09-15 MED ORDER — ACETAMINOPHEN 325 MG PO TABS: 650.0000 mg | ORAL_TABLET | Freq: Four times a day (QID) | ORAL | Status: DC | PRN

## 2017-09-15 MED ORDER — ONDANSETRON HCL 4 MG PO TABS: 4.0000 mg | ORAL_TABLET | Freq: Four times a day (QID) | ORAL | Status: DC | PRN

## 2017-09-15 MED ORDER — NALOXONE HCL 4 MG/0.1ML NA LIQD
1.0000 | Freq: Once | NASAL | Status: AC
  Administered 2017-09-15: 1 via NASAL
  Filled 2017-09-15: qty 4

## 2017-09-15 MED ORDER — ALPRAZOLAM 0.5 MG PO TABS
0.5000 mg | ORAL_TABLET | Freq: Three times a day (TID) | ORAL | Status: DC | PRN
  Administered 2017-09-15 – 2017-09-18 (×9): 0.5 mg via ORAL
  Filled 2017-09-15 (×9): qty 1

## 2017-09-15 MED ORDER — HEPARIN SODIUM (PORCINE) 5000 UNIT/ML IJ SOLN
5000.0000 [IU] | Freq: Three times a day (TID) | INTRAMUSCULAR | Status: DC
  Filled 2017-09-15 (×2): qty 1

## 2017-09-15 MED ORDER — NICOTINE 14 MG/24HR TD PT24
14.0000 mg | MEDICATED_PATCH | Freq: Every day | TRANSDERMAL | Status: DC
  Administered 2017-09-15: 14 mg via TRANSDERMAL
  Filled 2017-09-15 (×2): qty 1

## 2017-09-15 MED ORDER — POTASSIUM CHLORIDE CRYS ER 20 MEQ PO TBCR
40.0000 meq | EXTENDED_RELEASE_TABLET | ORAL | Status: AC
  Administered 2017-09-15 – 2017-09-16 (×3): 40 meq via ORAL
  Filled 2017-09-15 (×3): qty 2

## 2017-09-15 MED ORDER — ACETAMINOPHEN 650 MG RE SUPP: 650.0000 mg | Freq: Four times a day (QID) | RECTAL | Status: DC | PRN

## 2017-09-15 MED ORDER — INSULIN ASPART 100 UNIT/ML ~~LOC~~ SOLN: 0.0000 [IU] | Freq: Every day | SUBCUTANEOUS | Status: DC

## 2017-09-15 MED ORDER — PANTOPRAZOLE SODIUM 40 MG PO TBEC
40.0000 mg | DELAYED_RELEASE_TABLET | Freq: Every day | ORAL | Status: DC
  Administered 2017-09-15 – 2017-09-18 (×4): 40 mg via ORAL
  Filled 2017-09-15 (×4): qty 1

## 2017-09-15 MED ORDER — OXYCODONE HCL 5 MG PO TABS
5.0000 mg | ORAL_TABLET | Freq: Three times a day (TID) | ORAL | Status: DC | PRN
  Administered 2017-09-16 – 2017-09-18 (×6): 5 mg via ORAL
  Filled 2017-09-15 (×6): qty 1

## 2017-09-15 NOTE — ED Notes (Signed)
Black tourniquet found in pts belongings, witness by Mateo Flow in security and toss in trash. Encourage pt to get back in bed, placed back on heart monitor and bear hugger.  Pt cooperative at this time.

## 2017-09-15 NOTE — ED Triage Notes (Signed)
Pt was found in someone's a car with beer cans in the back. The owner of the car does not know this pt. Pt answers to name upon arrival to ems.

## 2017-09-15 NOTE — ED Notes (Signed)
Patient 5 blue pills flushed down toilet in ED, witnessed by Meyer Russel, RN as advised by [facility].

## 2017-09-15 NOTE — ED Notes (Signed)
Upon undressing pt, 5 loose blue pills fell out of patients pocket. Counted and verified by Gearldine Shown, RN. Pills placed in biohazard bag and placed on the counter in pt's room

## 2017-09-15 NOTE — Progress Notes (Signed)
Pt has refused IV insertion. Dr. Dyann Kief notified.

## 2017-09-15 NOTE — ED Notes (Signed)
Going in to assess pt and get report.  Pt is found sitting in chair and staff saying pt is trying to leave and is currently being IVC'd.  Security is waiting for police dept to arrive.

## 2017-09-15 NOTE — ED Notes (Signed)
Per verbal order from PA [facility], Foley temperature catheter is to be inserted due to pt's low temperature. Pt states that she will refuse any catheters and any requests for urine samples.

## 2017-09-15 NOTE — ED Notes (Signed)
Bair hugger applied due to low rectal temperature
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
## 2017-09-16 LAB — CBC
HCT: 37.3 % (ref 36.0–46.0)
HEMATOCRIT: 41 % (ref 36.0–46.0)
HEMOGLOBIN: 12.6 g/dL (ref 12.0–15.0)
Hemoglobin: 11.6 g/dL — ABNORMAL LOW (ref 12.0–15.0)
MCH: 27.7 pg (ref 26.0–34.0)
MCH: 27.5 pg (ref 26.0–34.0)
MCHC: 31.1 g/dL (ref 30.0–36.0)
MCHC: 30.7 g/dL (ref 30.0–36.0)
MCV: 89 fL (ref 78.0–100.0)
MCV: 89.5 fL (ref 78.0–100.0)
Platelets: 268 10*3/uL (ref 150–400)
Platelets: 338 10*3/uL (ref 150–400)
RBC: 4.19 MIL/uL (ref 3.87–5.11)
RBC: 4.58 MIL/uL (ref 3.87–5.11)
RDW: 14.7 % (ref 11.5–15.5)
RDW: 14.8 % (ref 11.5–15.5)
WBC: 8.8 10*3/uL (ref 4.0–10.5)
WBC: 12.1 10*3/uL — ABNORMAL HIGH (ref 4.0–10.5)

## 2017-09-16 LAB — GLUCOSE, CAPILLARY
GLUCOSE-CAPILLARY: 130 mg/dL — AB (ref 65–99)
Glucose-Capillary: 116 mg/dL — ABNORMAL HIGH (ref 65–99)
Glucose-Capillary: 147 mg/dL — ABNORMAL HIGH (ref 65–99)
Glucose-Capillary: 156 mg/dL — ABNORMAL HIGH (ref 65–99)

## 2017-09-16 LAB — BASIC METABOLIC PANEL
Anion gap: 11 (ref 5–15)
BUN: 15 mg/dL (ref 6–20)
CALCIUM: 9.3 mg/dL (ref 8.9–10.3)
CHLORIDE: 102 mmol/L (ref 101–111)
CO2: 25 mmol/L (ref 22–32)
CREATININE: 0.71 mg/dL (ref 0.44–1.00)
GFR calc non Af Amer: >60 mL/min (ref >60)
Glucose, Bld: 140 mg/dL — ABNORMAL HIGH (ref 65–99)
Potassium: 5 mmol/L (ref 3.5–5.1)
SODIUM: 138 mmol/L (ref 135–145)

## 2017-09-16 LAB — LACTIC ACID, PLASMA: Lactic Acid, Venous: 2.1 mmol/L (ref 0.5–1.9)

## 2017-09-16 LAB — SEDIMENTATION RATE: Sed Rate: 18 mm/hr (ref 0–22)

## 2017-09-16 LAB — VITAMIN B12: VITAMIN B 12: 375 pg/mL (ref 180–914)

## 2017-09-16 LAB — FOLATE: Folate: 8.5 ng/mL (ref >5.9)

## 2017-09-16 LAB — TSH: TSH: 1.287 u[IU]/mL (ref 0.350–4.500)

## 2017-09-16 MED ORDER — NICOTINE 21 MG/24HR TD PT24
21.0000 mg | MEDICATED_PATCH | Freq: Every day | TRANSDERMAL | Status: DC
Start: 2017-09-16 — End: 2017-09-18
  Administered 2017-09-16 – 2017-09-18 (×3): 21 mg via TRANSDERMAL
  Filled 2017-09-16 (×3): qty 1

## 2017-09-16 NOTE — Progress Notes (Signed)
Doctor gave order for psychiatry consult. Psychiatry consult order has been put in. I called the behavioral health number, [phone] and they told me to wait for the psychiatrist Dr. Mariea Clonts to call me from [phone]. Will continue to monitor patient.
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
## 2017-09-16 NOTE — Progress Notes (Signed)
CRITICAL VALUE ALERT  Critical Value:  Lactic acid 2.1   Date & Time Notied: 09/16/08 0405  Provider Notified: Olevia Bowens  Orders Received/Actions taken: no new orders, pt refusing to have IV placed and IV fluids or medication administered.

## 2017-09-16 NOTE — Progress Notes (Signed)
Patient admitted she states she gets Xanax from her psychiatrist she has a total right hip arthroplasty from May she gets methadone 10 mg p.o. 3 times daily she likewise receives low-dose Adderall 10 mg p.o. twice daily for inattentiveness she comes in with altered mental status in her urine drug screen shows positive for cocaine positive for THC negative for opioids and negative for amphetamines she was positive for benzo diazepam which would presumably be her Xanax patient appears anxious she was given hydrocodone low-dose to prevent withdrawal by the admitting physician.  I am planning to involuntarily commit her for inpatient psychiatry and polysubstance abuse of amphetamines as well as suspicion of diverting methadone as well as Adderall which were not found in the system. Diane Buchanan RKY:[id number] DOB: April 29, 1971 DOA: 09/15/2017 PCP: Lucia Gaskins, MD   Physical Exam: Blood pressure (!) 92/58, pulse 76, temperature (!) 97.2 F (36.2 C), temperature source Axillary, resp. rate 16, height 5\' 6"  (1.676 m), weight 65.5 kg (144 lb 8 oz), SpO2 100 %.  Lungs show prolonged expiratory phase scattered coarse rhonchi bilaterally mild end expiratory wheeze heart regular rhythm no S3-S4 no heaves thrills or rubs abdomen soft nontender bowel sounds normoactive   Investigations:  No results found for this or any previous visit (from the past 240 hour(s)).   Basic Metabolic Panel: Recent Labs    09/15/17 1130 09/15/17 1435 09/16/17 0448  NA 138  --  138  K 3.0*  --  5.0  CL 100*  --  102  CO2 26  --  25  GLUCOSE 75  --  140*  BUN 16  --  15  CREATININE 0.65  --  0.71  CALCIUM 9.0  --  9.3  MG 2.2  --   --   PHOS  --  4.7*  --    Liver Function Tests: Recent Labs    09/15/17 1130  AST 18  ALT 15  ALKPHOS 82  BILITOT 0.6  PROT 7.8  ALBUMIN 4.1     CBC: Recent Labs    09/15/17 1130 09/16/17 0448  WBC 12.4* 8.8  NEUTROABS 7.8*  --   HGB 11.7* 11.6*  HCT 37.4 37.3  MCV  87.8 89.0  PLT 267 268    Dg Chest Port 1 View  Result Date: 09/15/2017 CLINICAL DATA:  Altered mental status, hypothermia, elevated white blood cell count. History of asthma, diabetes, current smoker. EXAM: PORTABLE CHEST 1 VIEW COMPARISON:  Portable chest x-ray of December 23, 2014 FINDINGS: The lungs are well-expanded. The interstitial markings are coarse but much improved over the previous study. The heart and pulmonary vascularity are normal. The mediastinum is normal in width. The bony thorax exhibits no acute abnormality. IMPRESSION: Mild chronic bronchitic-smoking related interstitial changes. No acute pneumonia nor CHF. Electronically Signed   By: David  Martinique M.D.   On: 09/15/2017 13:21      Medications:   Impression:  Principal Problem:   Altered mental status Active Problems:   Hypotension   GERD (gastroesophageal reflux disease)   Type 2 diabetes mellitus without complication (HCC)   Depression   Chronic pain   Polysubstance abuse (HCC)   Dehydration   Tobacco abuse     Plan: Maintain low-dose opioids to prevent withdrawal.  Involuntarily committed patient for inpatient psych referral due to presumably diversion of Adderall and methadone.  Utilization of Xanax unbeknownst her primary care doctor myself.  Cocaine utilization as well as marijuana.  Consultants: Psychiatry requested   Procedures  Antibiotics:           Time spent: 45 minutes   LOS: 0 days   Tomma Ehinger M   09/16/2017, 2:11 PM

## 2017-09-17 ENCOUNTER — Encounter (HOSPITAL_COMMUNITY): Payer: Self-pay | Admitting: Registered Nurse

## 2017-09-17 LAB — BASIC METABOLIC PANEL
Anion gap: 10 (ref 5–15)
BUN: 16 mg/dL (ref 6–20)
CALCIUM: 8.9 mg/dL (ref 8.9–10.3)
CO2: 25 mmol/L (ref 22–32)
CREATININE: 0.62 mg/dL (ref 0.44–1.00)
Chloride: 100 mmol/L — ABNORMAL LOW (ref 101–111)
GFR calc non Af Amer: >60 mL/min (ref >60)
Glucose, Bld: 178 mg/dL — ABNORMAL HIGH (ref 65–99)
Potassium: 3.9 mmol/L (ref 3.5–5.1)
SODIUM: 135 mmol/L (ref 135–145)

## 2017-09-17 LAB — RPR: RPR Ser Ql: NONREACTIVE

## 2017-09-17 LAB — GLUCOSE, CAPILLARY
GLUCOSE-CAPILLARY: 209 mg/dL — AB (ref 65–99)
Glucose-Capillary: 303 mg/dL — ABNORMAL HIGH (ref 65–99)
Glucose-Capillary: 193 mg/dL — ABNORMAL HIGH (ref 65–99)
Glucose-Capillary: 188 mg/dL — ABNORMAL HIGH (ref 65–99)

## 2017-09-17 LAB — HIV ANTIBODY (ROUTINE TESTING W REFLEX): HIV Screen 4th Generation wRfx: NONREACTIVE
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
## 2017-09-17 NOTE — Progress Notes (Signed)
Patient reported to the NT that she thought she had a tampon in her vagina that was days old, but she couldn't reach it. The NT then reported this to me, I went and assessed the patient, she explained to me what was going on, I then went to the charge nurse who suggested getting the patient to "bear down" on the toilet to see if that would help dislodge the object. The patient tried this technique and was in fact able to pass an object that appeared to be a tampon that was dark brown and swollen with fluid. The patient reported relief to me once she passed the object. I have attempted to page Doctor Dondiego to let him know of this event, I have yet to hear from him. Will continue to monitor patient.

## 2017-09-18 LAB — GLUCOSE, CAPILLARY
GLUCOSE-CAPILLARY: 193 mg/dL — AB (ref 65–99)
Glucose-Capillary: 511 mg/dL (ref 65–99)

## 2017-09-18 LAB — BASIC METABOLIC PANEL
ANION GAP: 11 (ref 5–15)
BUN: 14 mg/dL (ref 6–20)
CALCIUM: 8.9 mg/dL (ref 8.9–10.3)
CO2: 25 mmol/L (ref 22–32)
Chloride: 103 mmol/L (ref 101–111)
Creatinine, Ser: 0.62 mg/dL (ref 0.44–1.00)
Glucose, Bld: 164 mg/dL — ABNORMAL HIGH (ref 65–99)
POTASSIUM: 3.6 mmol/L (ref 3.5–5.1)
Sodium: 139 mmol/L (ref 135–145)

## 2017-09-18 LAB — HEMOGLOBIN A1C
Hgb A1c MFr Bld: 6.1 % — ABNORMAL HIGH (ref 4.8–5.6)
Mean Plasma Glucose: 128.37 mg/dL

## 2017-09-18 MED ORDER — INSULIN GLARGINE 100 UNIT/ML ~~LOC~~ SOLN
10.0000 [IU] | Freq: Every day | SUBCUTANEOUS | Status: DC
  Filled 2017-09-18 (×3): qty 0.1

## 2017-09-18 MED ORDER — PAROXETINE HCL 20 MG PO TABS: 20.0000 mg | ORAL_TABLET | Freq: Every day | ORAL | Status: DC

## 2017-09-18 MED ORDER — METFORMIN HCL 500 MG PO TABS: 500.0000 mg | ORAL_TABLET | Freq: Two times a day (BID) | ORAL | Status: DC

## 2017-09-18 NOTE — Progress Notes (Signed)
Long talk with the patient who continues to make poor decisions for her life and health patient had a glucose of 511 refused insulin coverage from the RN I explained the risks benefits patient is supposed to be on Toujeo at home at bedtime dosage she has not taken this in weeks she is likewise also on Glucophage 500 twice daily at home supposedly will reinstitute Lantus 10 mg at bedtime as well as Glucophage 500 p.o. twice daily to assess glycemic control patient on nicotine patch for withdrawal of cigarettes patient on lower dose Xanax 0.5 p.o. 3 times daily and Norco 10/24/2023 p.o. 3 times daily to prevent withdrawal from methadone.  She continues to make poor decisions I have stated this clearly to the patient her life and health are at risk. Diane Buchanan [id number] DOB: Dec 04, 1970 DOA: 09/15/2017 PCP: Lucia Gaskins, MD   Physical Exam: Blood pressure 116/70, pulse 80, temperature 98.1 F (36.7 C), temperature source Oral, resp. rate 18, height 5\' 6"  (1.676 m), weight 65.5 kg (144 lb 8 oz), SpO2 97 %.  Lungs show scattered coarse rhonchi prolonged expiratory phase mild end expiratory wheeze no rales audible heart regular rhythm no S3-S4 no heaves thrills or rubs abdomen soft nontender bowel sounds normoactive   Investigations:  No results found for this or any previous visit (from the past 240 hour(s)).   Basic Metabolic Panel: Recent Labs    09/15/17 1435  09/17/17 0834 09/18/17 0406  NA  --    < > 135 139  K  --    < > 3.9 3.6  CL  --    < > 100* 103  CO2  --    < > 25 25  GLUCOSE  --    < > 178* 164*  BUN  --    < > 16 14  CREATININE  --    < > 0.62 0.62  CALCIUM  --    < > 8.9 8.9  PHOS 4.7*  --   --   --    < > = values in this interval not displayed.   Liver Function Tests: No results for input(s): AST, ALT, ALKPHOS, BILITOT, PROT, ALBUMIN in the last 72 hours.   CBC: Recent Labs    09/16/17 0448 09/16/17 1548  WBC 8.8 12.1*  HGB 11.6* 12.6  HCT 37.3 41.0   MCV 89.0 89.5  PLT 268 338    No results found.    Medications:  Impression:  Principal Problem:   Altered mental status Active Problems:   Hypotension   GERD (gastroesophageal reflux disease)   Type 2 diabetes mellitus without complication (HCC)   Depression   Chronic pain   Polysubstance abuse (HCC)   Dehydration   Tobacco abuse     Plan: Reinstitute Lantus 10 mg 10 units at bedtime nightly reinstitute metformin 500 p.o. twice daily monitor before meals and at bedtime glucoses continue nicotine patch 21 mg maintain Xanax and Norco at the low level/currently at will discontinue in another day.  Add Paxil 20 mg p.o. today and daily for antianxiety control.  Will obtain hemoglobin A1c today to assess recent long-term glucose glycemic control  Consultants:    Procedures   Antibiotics:          Time spent: 45 minutes   LOS: 0 days   Shi Blankenship M   09/18/2017, 12:47 PM

## 2017-09-18 NOTE — Progress Notes (Signed)
Patient requesting to leave AMA. No IV access or telemetry monitor, MD made aware. Patient signature on chart.  Celestia Khat, RN
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
## 2017-12-01 ENCOUNTER — Emergency Department (HOSPITAL_COMMUNITY)
Admission: EM | Admit: 2017-12-01 | Discharge: 2017-12-01 | Disposition: A | Discharge status: Home / Self Care | Payer: Medicaid Other | Attending: Emergency Medicine | Admitting: Emergency Medicine

## 2017-12-01 ENCOUNTER — Other Ambulatory Visit: Payer: Self-pay

## 2017-12-01 ENCOUNTER — Emergency Department (HOSPITAL_COMMUNITY): Payer: Medicaid Other

## 2017-12-01 ENCOUNTER — Encounter (HOSPITAL_COMMUNITY): Payer: Self-pay | Admitting: Emergency Medicine

## 2017-12-01 DIAGNOSIS — Z7984 Long term (current) use of oral hypoglycemic drugs: Secondary | ICD-10-CM | POA: Insufficient documentation

## 2017-12-01 DIAGNOSIS — Z041 Encounter for examination and observation following transport accident: Secondary | ICD-10-CM | POA: Diagnosis not present

## 2017-12-01 DIAGNOSIS — E119 Type 2 diabetes mellitus without complications: Secondary | ICD-10-CM | POA: Diagnosis not present

## 2017-12-01 DIAGNOSIS — Z8541 Personal history of malignant neoplasm of cervix uteri: Secondary | ICD-10-CM | POA: Diagnosis not present

## 2017-12-01 DIAGNOSIS — J45909 Unspecified asthma, uncomplicated: Secondary | ICD-10-CM | POA: Insufficient documentation

## 2017-12-01 DIAGNOSIS — Z79899 Other long term (current) drug therapy: Secondary | ICD-10-CM | POA: Diagnosis not present

## 2017-12-01 DIAGNOSIS — F17210 Nicotine dependence, cigarettes, uncomplicated: Secondary | ICD-10-CM | POA: Insufficient documentation

## 2017-12-01 DIAGNOSIS — M25512 Pain in left shoulder: Secondary | ICD-10-CM

## 2017-12-01 MED ORDER — NAPROXEN 500 MG PO TABS: 500.0000 mg | ORAL_TABLET | Freq: Two times a day (BID) | ORAL | 0 refills | Status: AC

## 2017-12-01 MED ORDER — CYCLOBENZAPRINE HCL 5 MG PO TABS: 5.0000 mg | ORAL_TABLET | Freq: Three times a day (TID) | ORAL | 0 refills | Status: AC | PRN

## 2017-12-01 MED ORDER — NAPROXEN 250 MG PO TABS
500.0000 mg | ORAL_TABLET | Freq: Once | ORAL | Status: AC
  Administered 2017-12-01: 500 mg via ORAL
  Filled 2017-12-01: qty 2
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
## 2017-12-01 NOTE — ED Notes (Signed)
Gave patient ice pack.  

## 2018-05-06 ENCOUNTER — Ambulatory Visit (INDEPENDENT_AMBULATORY_CARE_PROVIDER_SITE_OTHER): Payer: Medicaid Other | Admitting: Otolaryngology

## 2018-07-12 ENCOUNTER — Ambulatory Visit (INDEPENDENT_AMBULATORY_CARE_PROVIDER_SITE_OTHER): Payer: Medicaid Other | Admitting: Otolaryngology

## 2018-10-03 ENCOUNTER — Other Ambulatory Visit: Payer: Self-pay

## 2018-10-03 ENCOUNTER — Encounter (HOSPITAL_COMMUNITY): Payer: Self-pay | Admitting: *Deleted

## 2018-10-03 ENCOUNTER — Emergency Department (HOSPITAL_COMMUNITY): Payer: Medicaid Other

## 2018-10-03 ENCOUNTER — Emergency Department (HOSPITAL_COMMUNITY)
Admission: EM | Admit: 2018-10-03 | Discharge: 2018-10-03 | Disposition: A | Discharge status: Home / Self Care | Payer: Medicaid Other | Attending: Emergency Medicine | Admitting: Emergency Medicine

## 2018-10-03 DIAGNOSIS — Z8541 Personal history of malignant neoplasm of cervix uteri: Secondary | ICD-10-CM | POA: Diagnosis not present

## 2018-10-03 DIAGNOSIS — J45909 Unspecified asthma, uncomplicated: Secondary | ICD-10-CM | POA: Diagnosis not present

## 2018-10-03 DIAGNOSIS — S62652B Nondisplaced fracture of medial phalanx of right middle finger, initial encounter for open fracture: Secondary | ICD-10-CM | POA: Diagnosis not present

## 2018-10-03 DIAGNOSIS — Y939 Activity, unspecified: Secondary | ICD-10-CM | POA: Diagnosis not present

## 2018-10-03 DIAGNOSIS — F17210 Nicotine dependence, cigarettes, uncomplicated: Secondary | ICD-10-CM | POA: Insufficient documentation

## 2018-10-03 DIAGNOSIS — S60940A Unspecified superficial injury of right index finger, initial encounter: Secondary | ICD-10-CM | POA: Diagnosis present

## 2018-10-03 DIAGNOSIS — Y929 Unspecified place or not applicable: Secondary | ICD-10-CM | POA: Insufficient documentation

## 2018-10-03 DIAGNOSIS — Z7984 Long term (current) use of oral hypoglycemic drugs: Secondary | ICD-10-CM | POA: Diagnosis not present

## 2018-10-03 DIAGNOSIS — Z96649 Presence of unspecified artificial hip joint: Secondary | ICD-10-CM | POA: Insufficient documentation

## 2018-10-03 DIAGNOSIS — E119 Type 2 diabetes mellitus without complications: Secondary | ICD-10-CM | POA: Insufficient documentation

## 2018-10-03 DIAGNOSIS — W230XXA Caught, crushed, jammed, or pinched between moving objects, initial encounter: Secondary | ICD-10-CM | POA: Insufficient documentation

## 2018-10-03 DIAGNOSIS — Z79899 Other long term (current) drug therapy: Secondary | ICD-10-CM | POA: Diagnosis not present

## 2018-10-03 DIAGNOSIS — Y999 Unspecified external cause status: Secondary | ICD-10-CM | POA: Diagnosis not present

## 2018-10-03 MED ORDER — AMOXICILLIN-POT CLAVULANATE 875-125 MG PO TABS: 1.0000 | ORAL_TABLET | Freq: Two times a day (BID) | ORAL | 0 refills | Status: AC

## 2018-10-03 MED ORDER — AMOXICILLIN-POT CLAVULANATE 875-125 MG PO TABS
1.0000 | ORAL_TABLET | Freq: Once | ORAL | Status: AC
  Administered 2018-10-03: 1 via ORAL
  Filled 2018-10-03: qty 1

## 2018-10-03 MED ORDER — MELOXICAM 7.5 MG PO TABS: 15.0000 mg | ORAL_TABLET | Freq: Every day | ORAL | 0 refills | Status: AC

## 2018-10-03 MED ORDER — HYDROCODONE-ACETAMINOPHEN 5-325 MG PO TABS: 1.0000 | ORAL_TABLET | Freq: Four times a day (QID) | ORAL | 0 refills | Status: AC | PRN

## 2018-10-03 MED ORDER — IBUPROFEN 800 MG PO TABS
800.0000 mg | ORAL_TABLET | Freq: Once | ORAL | Status: AC
  Administered 2018-10-03: 02:00:00 800 mg via ORAL
  Filled 2018-10-03: qty 1

## 2018-10-03 MED ORDER — BACITRACIN ZINC 500 UNIT/GM EX OINT: 1.0000 "application " | TOPICAL_OINTMENT | Freq: Two times a day (BID) | CUTANEOUS | 0 refills | Status: AC

## 2018-10-03 NOTE — ED Notes (Signed)
Pts hand cleaned with saline. Wrapped with vaseline gauze and finger splint placed on finger with cotton gauze on top wrapping the finger.
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
## 2018-10-03 NOTE — ED Notes (Signed)
Pt ambulatory to waiting room. Pt verbalized understanding of discharge instructions.   

## 2018-10-03 NOTE — Discharge Instructions (Addendum)
I have recommended that you have this wound evaluated and repaired in the emergency department.  It appears that you do have a fracture of the finger underneath the cut.  I have recommended stitches but you have refused.  Please use the finger splint with topical antibiotics and take the oral antibiotics as well.  Augmentin twice daily, bacitracin topical ointment with a sterile dressing twice daily and keep the finger splint in place.  Dr. Jeannie Fend has been kind enough to see you in the office on Tuesday.  I would recommend that you call Monday morning to make this appointment.  You must follow-up with this doctor, he will be able to take care of your finger fracture.
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
## 2018-12-30 ENCOUNTER — Ambulatory Visit (INDEPENDENT_AMBULATORY_CARE_PROVIDER_SITE_OTHER): Payer: Medicaid Other | Admitting: Otolaryngology

## 2018-12-30 ENCOUNTER — Other Ambulatory Visit: Payer: Medicaid Other

## 2018-12-30 ENCOUNTER — Other Ambulatory Visit: Payer: Self-pay

## 2018-12-30 DIAGNOSIS — H6123 Impacted cerumen, bilateral: Secondary | ICD-10-CM | POA: Diagnosis not present

## 2018-12-30 DIAGNOSIS — Z20822 Contact with and (suspected) exposure to covid-19: Secondary | ICD-10-CM

## 2018-12-30 DIAGNOSIS — H903 Sensorineural hearing loss, bilateral: Secondary | ICD-10-CM

## 2019-01-03 LAB — NOVEL CORONAVIRUS, NAA: SARS-CoV-2, NAA: NOT DETECTED

## 2019-10-17 ENCOUNTER — Ambulatory Visit (HOSPITAL_COMMUNITY)
Admission: RE | Admit: 2019-10-17 | Discharge: 2019-10-17 | Disposition: A | Discharge status: Home / Self Care | Payer: Medicaid Other | Source: Ambulatory Visit | Attending: Family Medicine | Admitting: Family Medicine

## 2019-10-17 ENCOUNTER — Other Ambulatory Visit: Payer: Self-pay

## 2019-10-17 ENCOUNTER — Other Ambulatory Visit (HOSPITAL_COMMUNITY): Payer: Self-pay | Admitting: Family Medicine

## 2019-10-17 DIAGNOSIS — M25551 Pain in right hip: Secondary | ICD-10-CM

## 2020-08-07 ENCOUNTER — Other Ambulatory Visit: Payer: Self-pay

## 2020-08-07 ENCOUNTER — Ambulatory Visit
Admission: EM | Admit: 2020-08-07 | Discharge: 2020-08-07 | Disposition: A | Discharge status: Home / Self Care | Payer: Medicaid Other | Attending: Family Medicine | Admitting: Family Medicine

## 2020-08-07 ENCOUNTER — Encounter: Payer: Self-pay | Admitting: Emergency Medicine

## 2020-08-07 DIAGNOSIS — Z20822 Contact with and (suspected) exposure to covid-19: Secondary | ICD-10-CM | POA: Diagnosis not present

## 2020-08-07 NOTE — ED Triage Notes (Signed)
Cough wants covid test. Nurse visit only

## 2020-08-08 LAB — SARS-COV-2, NAA 2 DAY TAT

## 2020-08-08 LAB — NOVEL CORONAVIRUS, NAA: SARS-CoV-2, NAA: NOT DETECTED

## 2020-08-21 DEATH — deceased
